# Patient Record
Sex: Female | Born: 1955
Health system: Southern US, Community
[De-identification: ages and names within clinical notes are randomized; demographics above are authoritative.]

## PROBLEM LIST (undated history)

## (undated) ENCOUNTER — Emergency Department (HOSPITAL_COMMUNITY): Discharge status: Against Medical Advice | Payer: BC Managed Care – PPO

## (undated) DIAGNOSIS — F329 Major depressive disorder, single episode, unspecified: Secondary | ICD-10-CM

## (undated) DIAGNOSIS — M797 Fibromyalgia: Secondary | ICD-10-CM

## (undated) DIAGNOSIS — A6920 Lyme disease, unspecified: Secondary | ICD-10-CM

## (undated) DIAGNOSIS — I10 Essential (primary) hypertension: Secondary | ICD-10-CM

## (undated) DIAGNOSIS — K224 Dyskinesia of esophagus: Secondary | ICD-10-CM

## (undated) HISTORY — PX: DIAGNOSTIC LAPAROSCOPY: SUR761

## (undated) HISTORY — PX: APPENDECTOMY: SHX54

## (undated) HISTORY — PX: OTHER SURGICAL HISTORY: SHX169

---

## 2007-01-10 HISTORY — PX: BREAST BIOPSY: SHX20

## 2008-11-03 ENCOUNTER — Encounter: Admission: RE | Admit: 2008-11-03 | Discharge: 2008-11-03 | Payer: Self-pay | Admitting: Family Medicine

## 2008-12-16 ENCOUNTER — Other Ambulatory Visit: Admission: RE | Admit: 2008-12-16 | Discharge: 2008-12-16 | Payer: Self-pay | Admitting: Family Medicine

## 2009-11-04 ENCOUNTER — Encounter: Admission: RE | Admit: 2009-11-04 | Discharge: 2009-11-04 | Payer: Self-pay | Admitting: Family Medicine

## 2010-05-19 ENCOUNTER — Other Ambulatory Visit: Payer: Self-pay | Admitting: Gastroenterology

## 2010-05-19 DIAGNOSIS — R1110 Vomiting, unspecified: Secondary | ICD-10-CM

## 2010-05-20 ENCOUNTER — Ambulatory Visit
Admission: RE | Admit: 2010-05-20 | Discharge: 2010-05-20 | Disposition: A | Discharge status: Home / Self Care | Payer: BC Managed Care – PPO | Source: Ambulatory Visit | Attending: Gastroenterology | Admitting: Gastroenterology

## 2010-05-20 DIAGNOSIS — R1110 Vomiting, unspecified: Secondary | ICD-10-CM

## 2010-05-25 ENCOUNTER — Other Ambulatory Visit (HOSPITAL_COMMUNITY): Payer: Self-pay | Admitting: Gastroenterology

## 2010-05-25 DIAGNOSIS — R1110 Vomiting, unspecified: Secondary | ICD-10-CM

## 2010-05-30 ENCOUNTER — Ambulatory Visit (HOSPITAL_COMMUNITY)
Admission: RE | Admit: 2010-05-30 | Discharge: 2010-05-30 | Disposition: A | Discharge status: Home / Self Care | Payer: BC Managed Care – PPO | Source: Ambulatory Visit | Attending: Gastroenterology | Admitting: Gastroenterology

## 2010-05-30 DIAGNOSIS — R1110 Vomiting, unspecified: Secondary | ICD-10-CM | POA: Insufficient documentation

## 2010-05-30 DIAGNOSIS — R932 Abnormal findings on diagnostic imaging of liver and biliary tract: Secondary | ICD-10-CM | POA: Insufficient documentation

## 2010-05-30 MED ORDER — TECHNETIUM TC 99M MEBROFENIN IV KIT
5.0000 | PACK | Freq: Once | INTRAVENOUS | Status: AC | PRN
  Administered 2010-05-30: 5.4 via INTRAVENOUS

## 2010-06-07 ENCOUNTER — Encounter (HOSPITAL_COMMUNITY)
Admission: RE | Admit: 2010-06-07 | Discharge: 2010-06-07 | Disposition: A | Discharge status: Home / Self Care | Payer: BC Managed Care – PPO | Source: Ambulatory Visit | Attending: Gastroenterology | Admitting: Gastroenterology

## 2010-06-07 DIAGNOSIS — K3189 Other diseases of stomach and duodenum: Secondary | ICD-10-CM | POA: Insufficient documentation

## 2010-06-07 DIAGNOSIS — R1110 Vomiting, unspecified: Secondary | ICD-10-CM | POA: Insufficient documentation

## 2010-06-07 DIAGNOSIS — R1013 Epigastric pain: Secondary | ICD-10-CM | POA: Insufficient documentation

## 2010-06-16 ENCOUNTER — Emergency Department (HOSPITAL_COMMUNITY)
Admission: EM | Admit: 2010-06-16 | Discharge: 2010-06-16 | Disposition: A | Discharge status: Home / Self Care | Payer: BC Managed Care – PPO | Attending: Emergency Medicine | Admitting: Emergency Medicine

## 2010-06-16 ENCOUNTER — Emergency Department (HOSPITAL_COMMUNITY): Payer: BC Managed Care – PPO

## 2010-06-16 DIAGNOSIS — R079 Chest pain, unspecified: Secondary | ICD-10-CM | POA: Insufficient documentation

## 2010-06-16 DIAGNOSIS — R1011 Right upper quadrant pain: Secondary | ICD-10-CM | POA: Insufficient documentation

## 2010-06-16 DIAGNOSIS — R630 Anorexia: Secondary | ICD-10-CM | POA: Insufficient documentation

## 2010-06-16 DIAGNOSIS — R197 Diarrhea, unspecified: Secondary | ICD-10-CM | POA: Insufficient documentation

## 2010-06-16 DIAGNOSIS — Z79899 Other long term (current) drug therapy: Secondary | ICD-10-CM | POA: Insufficient documentation

## 2010-06-16 DIAGNOSIS — R112 Nausea with vomiting, unspecified: Secondary | ICD-10-CM | POA: Insufficient documentation

## 2010-06-16 LAB — URINALYSIS, ROUTINE W REFLEX MICROSCOPIC
Nitrite: NEGATIVE
Specific Gravity, Urine: 1.01 (ref 1.005–1.030)
pH: 6 (ref 5.0–8.0)

## 2010-06-16 LAB — COMPREHENSIVE METABOLIC PANEL
Albumin: 4 g/dL (ref 3.5–5.2)
Alkaline Phosphatase: 56 U/L (ref 39–117)
BUN: 5 mg/dL — ABNORMAL LOW (ref 6–23)
Creatinine, Ser: 0.52 mg/dL (ref 0.4–1.2)
Potassium: 3.6 mEq/L (ref 3.5–5.1)
Total Protein: 7.5 g/dL (ref 6.0–8.3)

## 2010-06-16 LAB — URINE MICROSCOPIC-ADD ON

## 2010-06-16 LAB — CBC
MCV: 87.2 fL (ref 78.0–100.0)
Platelets: 262 10*3/uL (ref 150–400)
RDW: 14.3 % (ref 11.5–15.5)
WBC: 7.9 10*3/uL (ref 4.0–10.5)

## 2010-06-16 LAB — DIFFERENTIAL
Eosinophils Absolute: 0.1 10*3/uL (ref 0.0–0.7)
Eosinophils Relative: 1 % (ref 0–5)
Lymphs Abs: 3.1 10*3/uL (ref 0.7–4.0)

## 2010-06-17 LAB — URINE CULTURE
Colony Count: 9000
Culture  Setup Time: 201206072112

## 2010-07-07 ENCOUNTER — Other Ambulatory Visit: Payer: Self-pay | Admitting: Family Medicine

## 2010-07-07 ENCOUNTER — Other Ambulatory Visit (HOSPITAL_COMMUNITY)
Admission: RE | Admit: 2010-07-07 | Discharge: 2010-07-07 | Disposition: A | Discharge status: Home / Self Care | Payer: BC Managed Care – PPO | Source: Ambulatory Visit | Attending: Dermatology | Admitting: Dermatology

## 2010-07-07 DIAGNOSIS — Z0000 Encounter for general adult medical examination without abnormal findings: Secondary | ICD-10-CM | POA: Insufficient documentation

## 2010-09-26 ENCOUNTER — Other Ambulatory Visit: Payer: Self-pay | Admitting: Family Medicine

## 2010-09-26 DIAGNOSIS — Z1231 Encounter for screening mammogram for malignant neoplasm of breast: Secondary | ICD-10-CM

## 2010-11-07 ENCOUNTER — Ambulatory Visit: Payer: BC Managed Care – PPO

## 2010-11-21 ENCOUNTER — Ambulatory Visit
Admission: RE | Admit: 2010-11-21 | Discharge: 2010-11-21 | Disposition: A | Discharge status: Home / Self Care | Payer: BC Managed Care – PPO | Source: Ambulatory Visit | Attending: Family Medicine | Admitting: Family Medicine

## 2010-11-21 DIAGNOSIS — Z1231 Encounter for screening mammogram for malignant neoplasm of breast: Secondary | ICD-10-CM

## 2011-10-27 ENCOUNTER — Other Ambulatory Visit: Payer: Self-pay | Admitting: Family Medicine

## 2011-10-27 DIAGNOSIS — Z1231 Encounter for screening mammogram for malignant neoplasm of breast: Secondary | ICD-10-CM

## 2011-11-30 ENCOUNTER — Ambulatory Visit
Admission: RE | Admit: 2011-11-30 | Discharge: 2011-11-30 | Disposition: A | Discharge status: Home / Self Care | Payer: BC Managed Care – PPO | Source: Ambulatory Visit | Attending: Family Medicine | Admitting: Family Medicine

## 2011-11-30 DIAGNOSIS — Z1231 Encounter for screening mammogram for malignant neoplasm of breast: Secondary | ICD-10-CM

## 2012-02-25 IMAGING — US US ABDOMEN LIMITED
1 series · 14 of 21 positions shown · non-contrast
Comparison: 05/20/2010

CLINICAL DATA: Evaluate gallbladder

LIMITED ABDOMINAL ULTRASOUND - RIGHT UPPER QUADRANT

[Series 1: us abdomen limited · 0.29mm/px · 14 of 21 slices shown]
[im 1/21]
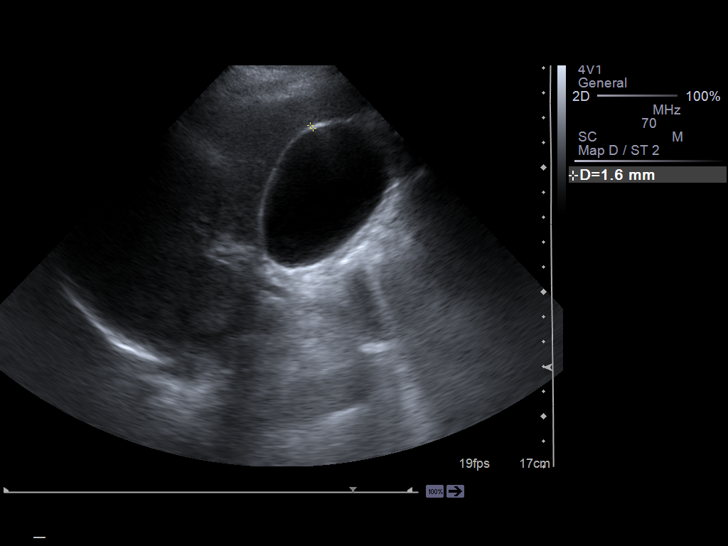
[im 3/21]
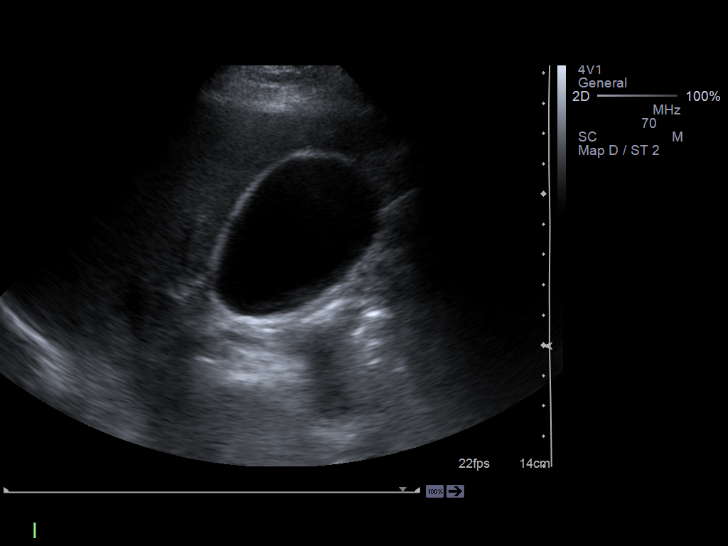
[im 4/21]
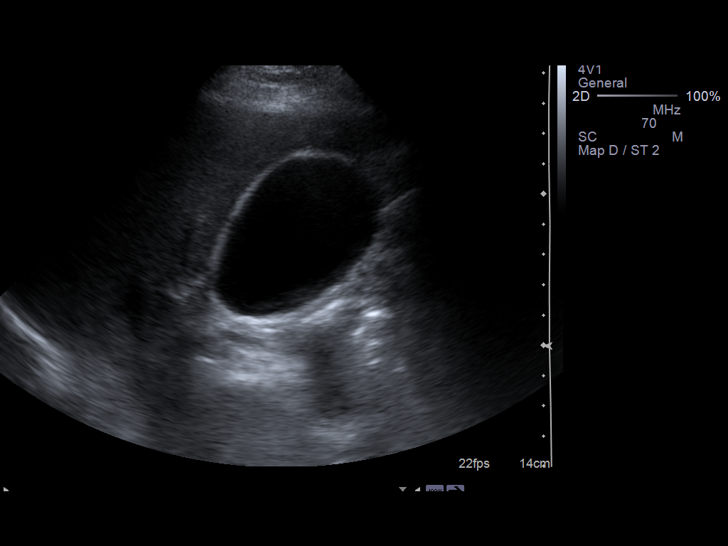
[im 6/21]
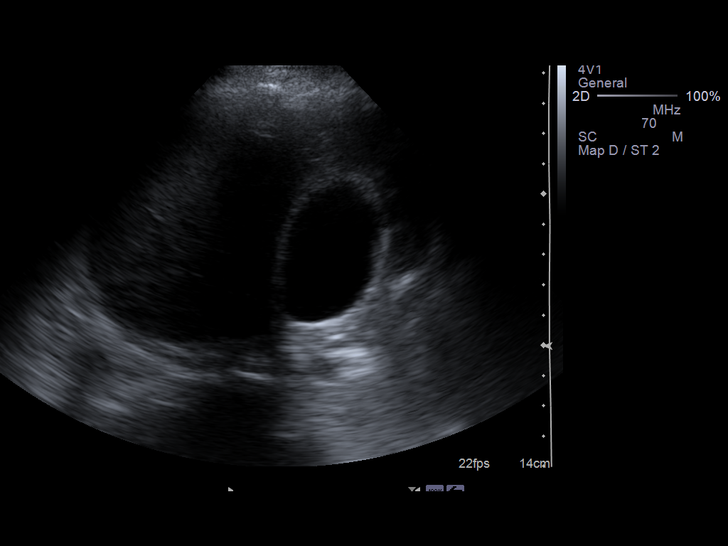
[im 7/21]
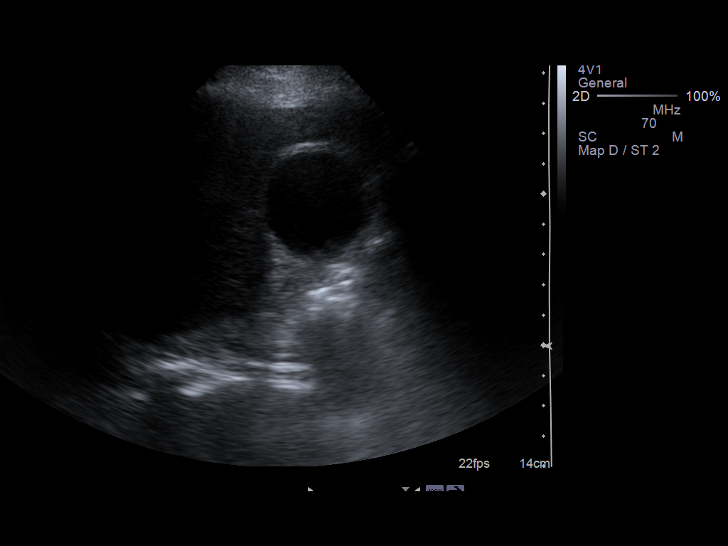
[im 9/21]
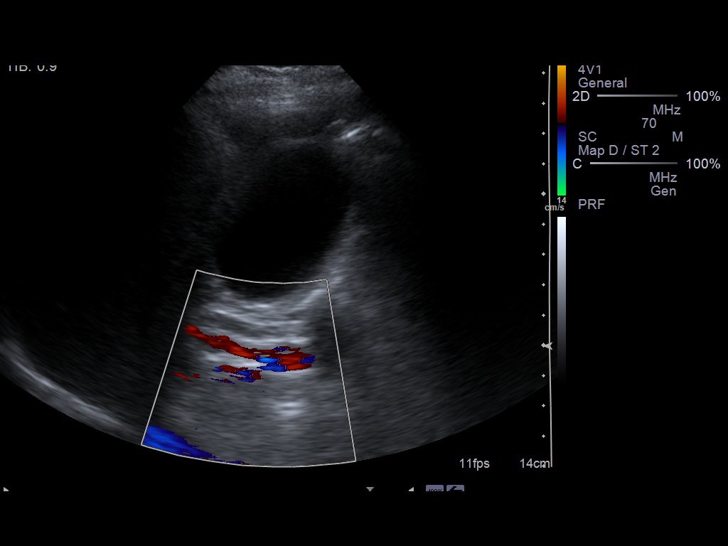
[im 10/21]
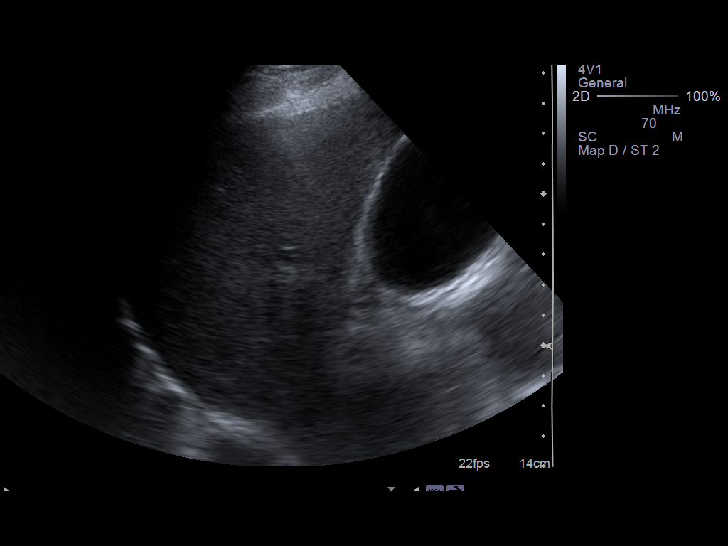
[im 12/21]
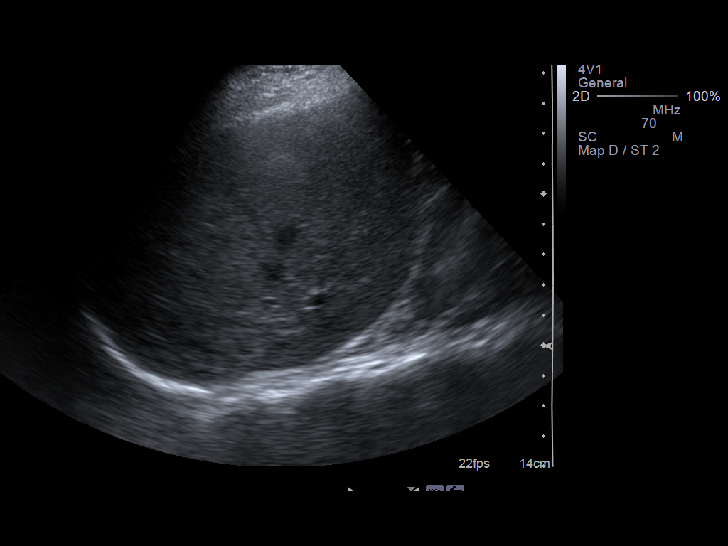
[im 13/21]
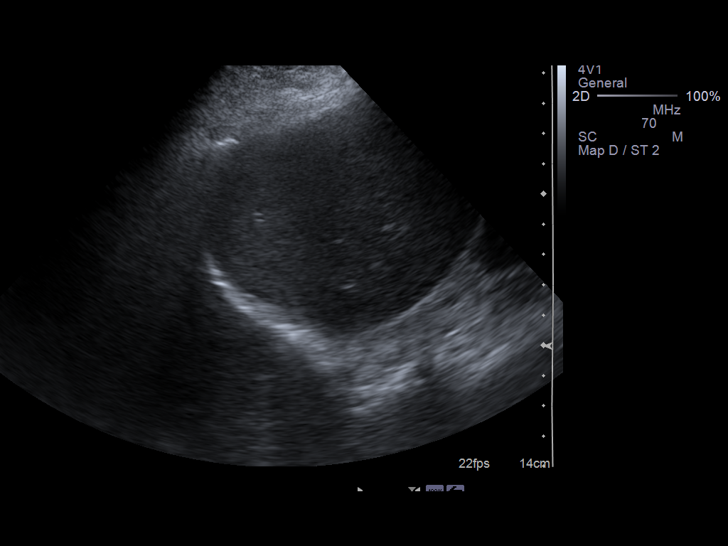
[im 15/21]
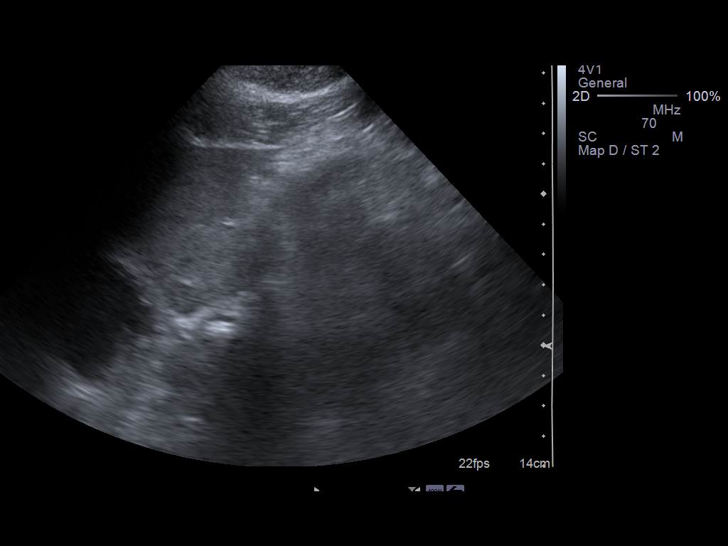
[im 16/21]
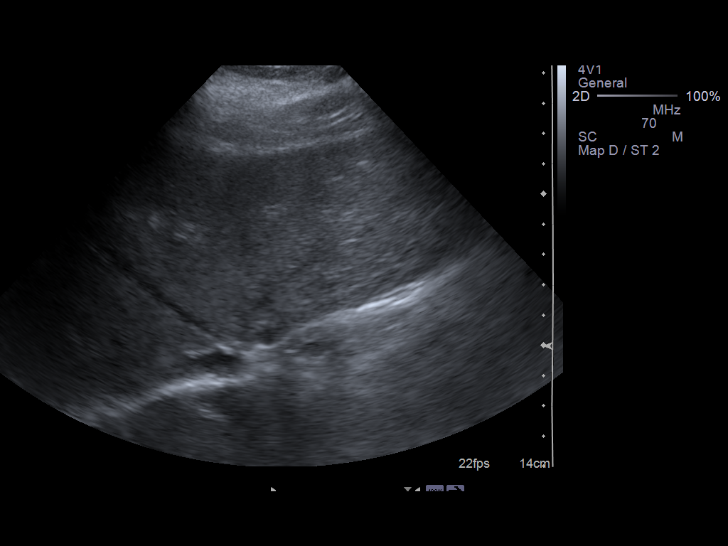
[im 18/21]
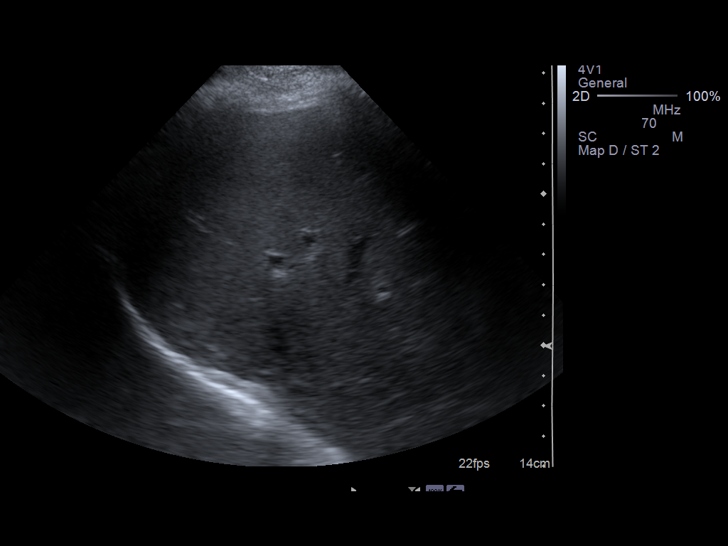
[im 19/21]
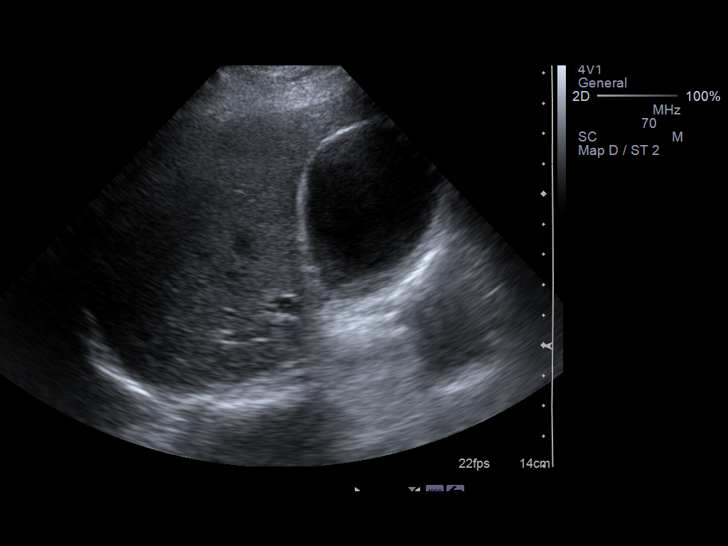
[im 21/21]
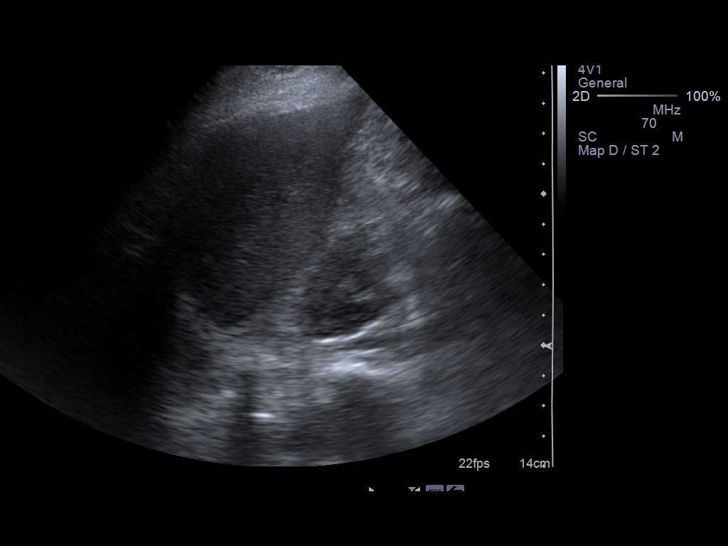

[14 of 21 positions shown; findings below may reference images not displayed]

FINDINGS: Gallbladder:  No cholelithiasis or gallbladder sludge.  Gallbladder
wall is not thickened and there is no pericholecystic edema.

Common bile duct:  Normal caliber bile ducts with common duct
diameter measuring about 5 mm.

Liver:  Homogeneous liver parenchymal echotexture without focal
mass lesion demonstrated.  No intrahepatic bile duct dilatation.
IMPRESSION: No evidence of cholelithiasis or cholecystitis.

## 2012-11-04 ENCOUNTER — Other Ambulatory Visit: Payer: Self-pay

## 2012-11-04 DIAGNOSIS — Z1231 Encounter for screening mammogram for malignant neoplasm of breast: Secondary | ICD-10-CM

## 2012-12-03 ENCOUNTER — Ambulatory Visit
Admission: RE | Admit: 2012-12-03 | Discharge: 2012-12-03 | Disposition: A | Discharge status: Home / Self Care | Payer: BC Managed Care – PPO | Source: Ambulatory Visit

## 2012-12-03 DIAGNOSIS — Z1231 Encounter for screening mammogram for malignant neoplasm of breast: Secondary | ICD-10-CM

## 2013-12-08 ENCOUNTER — Other Ambulatory Visit: Payer: Self-pay

## 2013-12-08 DIAGNOSIS — Z1231 Encounter for screening mammogram for malignant neoplasm of breast: Secondary | ICD-10-CM

## 2013-12-11 ENCOUNTER — Other Ambulatory Visit (HOSPITAL_COMMUNITY)
Admission: RE | Admit: 2013-12-11 | Discharge: 2013-12-11 | Disposition: A | Discharge status: Home / Self Care | Payer: BC Managed Care – PPO | Source: Ambulatory Visit | Attending: Family Medicine | Admitting: Family Medicine

## 2013-12-11 ENCOUNTER — Other Ambulatory Visit: Payer: Self-pay | Admitting: Family Medicine

## 2013-12-11 DIAGNOSIS — Z01419 Encounter for gynecological examination (general) (routine) without abnormal findings: Secondary | ICD-10-CM | POA: Insufficient documentation

## 2013-12-12 LAB — CYTOLOGY - PAP

## 2013-12-18 ENCOUNTER — Ambulatory Visit
Admission: RE | Admit: 2013-12-18 | Discharge: 2013-12-18 | Disposition: A | Discharge status: Home / Self Care | Payer: BC Managed Care – PPO | Source: Ambulatory Visit

## 2013-12-18 DIAGNOSIS — Z1231 Encounter for screening mammogram for malignant neoplasm of breast: Secondary | ICD-10-CM

## 2014-11-30 ENCOUNTER — Other Ambulatory Visit: Payer: Self-pay

## 2014-11-30 DIAGNOSIS — Z1231 Encounter for screening mammogram for malignant neoplasm of breast: Secondary | ICD-10-CM

## 2014-12-22 ENCOUNTER — Ambulatory Visit
Admission: RE | Admit: 2014-12-22 | Discharge: 2014-12-22 | Disposition: A | Discharge status: Home / Self Care | Payer: BLUE CROSS/BLUE SHIELD | Source: Ambulatory Visit

## 2014-12-22 DIAGNOSIS — Z1231 Encounter for screening mammogram for malignant neoplasm of breast: Secondary | ICD-10-CM

## 2015-06-02 DIAGNOSIS — K219 Gastro-esophageal reflux disease without esophagitis: Secondary | ICD-10-CM | POA: Diagnosis not present

## 2015-06-02 DIAGNOSIS — K3184 Gastroparesis: Secondary | ICD-10-CM | POA: Diagnosis not present

## 2015-06-16 DIAGNOSIS — I10 Essential (primary) hypertension: Secondary | ICD-10-CM | POA: Diagnosis not present

## 2015-06-16 DIAGNOSIS — E785 Hyperlipidemia, unspecified: Secondary | ICD-10-CM | POA: Diagnosis not present

## 2015-06-16 DIAGNOSIS — F5101 Primary insomnia: Secondary | ICD-10-CM | POA: Diagnosis not present

## 2015-06-16 DIAGNOSIS — M797 Fibromyalgia: Secondary | ICD-10-CM | POA: Diagnosis not present

## 2015-08-09 ENCOUNTER — Encounter (HOSPITAL_COMMUNITY): Payer: Self-pay | Admitting: Emergency Medicine

## 2015-08-09 ENCOUNTER — Emergency Department (HOSPITAL_COMMUNITY): Payer: BLUE CROSS/BLUE SHIELD

## 2015-08-09 ENCOUNTER — Ambulatory Visit (HOSPITAL_COMMUNITY)
Admission: EM | Admit: 2015-08-09 | Discharge: 2015-08-11 | Disposition: A | Discharge status: Home / Self Care | Payer: BLUE CROSS/BLUE SHIELD | Attending: General Surgery | Admitting: General Surgery

## 2015-08-09 DIAGNOSIS — M797 Fibromyalgia: Secondary | ICD-10-CM | POA: Diagnosis not present

## 2015-08-09 DIAGNOSIS — F32A Depression, unspecified: Secondary | ICD-10-CM | POA: Diagnosis present

## 2015-08-09 DIAGNOSIS — Z79899 Other long term (current) drug therapy: Secondary | ICD-10-CM | POA: Diagnosis not present

## 2015-08-09 DIAGNOSIS — K3580 Unspecified acute appendicitis: Secondary | ICD-10-CM | POA: Diagnosis present

## 2015-08-09 DIAGNOSIS — K353 Acute appendicitis with localized peritonitis, without perforation or gangrene: Secondary | ICD-10-CM

## 2015-08-09 DIAGNOSIS — K224 Dyskinesia of esophagus: Secondary | ICD-10-CM | POA: Diagnosis not present

## 2015-08-09 DIAGNOSIS — F329 Major depressive disorder, single episode, unspecified: Secondary | ICD-10-CM | POA: Diagnosis not present

## 2015-08-09 DIAGNOSIS — I10 Essential (primary) hypertension: Secondary | ICD-10-CM | POA: Insufficient documentation

## 2015-08-09 DIAGNOSIS — Z87442 Personal history of urinary calculi: Secondary | ICD-10-CM | POA: Insufficient documentation

## 2015-08-09 HISTORY — DX: Essential (primary) hypertension: I10

## 2015-08-09 HISTORY — DX: Lyme disease, unspecified: A69.20

## 2015-08-09 HISTORY — DX: Fibromyalgia: M79.7

## 2015-08-09 HISTORY — DX: Dyskinesia of esophagus: K22.4

## 2015-08-09 HISTORY — DX: Major depressive disorder, single episode, unspecified: F32.9

## 2015-08-09 LAB — COMPREHENSIVE METABOLIC PANEL
ALBUMIN: 4.3 g/dL (ref 3.5–5.0)
ALK PHOS: 67 U/L (ref 38–126)
ALT: 14 U/L (ref 14–54)
ANION GAP: 8 (ref 5–15)
AST: 21 U/L (ref 15–41)
BUN: 9 mg/dL (ref 6–20)
CALCIUM: 9.3 mg/dL (ref 8.9–10.3)
CHLORIDE: 104 mmol/L (ref 101–111)
CO2: 29 mmol/L (ref 22–32)
Creatinine, Ser: 0.77 mg/dL (ref 0.44–1.00)
GFR calc non Af Amer: >60 mL/min (ref >60)
GLUCOSE: 114 mg/dL — AB (ref 65–99)
Potassium: 3.6 mmol/L (ref 3.5–5.1)
SODIUM: 141 mmol/L (ref 135–145)
Total Bilirubin: 0.7 mg/dL (ref 0.3–1.2)
Total Protein: 7.9 g/dL (ref 6.5–8.1)

## 2015-08-09 LAB — CBC
HCT: 35.7 % — ABNORMAL LOW (ref 36.0–46.0)
HEMOGLOBIN: 11.9 g/dL — AB (ref 12.0–15.0)
MCH: 30.6 pg (ref 26.0–34.0)
MCHC: 33.3 g/dL (ref 30.0–36.0)
MCV: 91.8 fL (ref 78.0–100.0)
Platelets: 279 10*3/uL (ref 150–400)
RBC: 3.89 MIL/uL (ref 3.87–5.11)
RDW: 13.9 % (ref 11.5–15.5)
WBC: 12.7 10*3/uL — ABNORMAL HIGH (ref 4.0–10.5)

## 2015-08-09 LAB — URINALYSIS, ROUTINE W REFLEX MICROSCOPIC
BILIRUBIN URINE: NEGATIVE
GLUCOSE, UA: NEGATIVE mg/dL
HGB URINE DIPSTICK: NEGATIVE
Ketones, ur: NEGATIVE mg/dL
Leukocytes, UA: NEGATIVE
Nitrite: NEGATIVE
PH: 6 (ref 5.0–8.0)
Protein, ur: NEGATIVE mg/dL
SPECIFIC GRAVITY, URINE: 1.014 (ref 1.005–1.030)

## 2015-08-09 LAB — LIPASE, BLOOD: LIPASE: 28 U/L (ref 11–51)

## 2015-08-09 MED ORDER — ONDANSETRON HCL 4 MG/2ML IJ SOLN
4.0000 mg | Freq: Once | INTRAMUSCULAR | Status: AC
  Administered 2015-08-09: 4 mg via INTRAVENOUS
  Filled 2015-08-09: qty 2

## 2015-08-09 MED ORDER — MORPHINE SULFATE (PF) 4 MG/ML IV SOLN
4.0000 mg | Freq: Once | INTRAVENOUS | Status: AC
  Administered 2015-08-09: 4 mg via INTRAVENOUS
  Filled 2015-08-09: qty 1

## 2015-08-09 MED ORDER — FENTANYL CITRATE (PF) 100 MCG/2ML IJ SOLN
50.0000 ug | INTRAMUSCULAR | Status: DC | PRN
  Administered 2015-08-09: 50 ug via INTRAVENOUS
  Filled 2015-08-09: qty 2

## 2015-08-09 NOTE — ED Notes (Signed)
Bed: WTR7 Expected date:  Expected time:  Means of arrival:  Comments: Blood Draw

## 2015-08-09 NOTE — ED Triage Notes (Signed)
Pt is c/o right side abd pain that started at 4 am and has progressively gotten worse all day  Pt states the pain is a cramping pain that radiates toward the umbilicus  Pt denies N/V/D

## 2015-08-09 NOTE — ED Provider Notes (Signed)
[facility]-EMERGENCY DEPT Provider Note   CSN: [id number] Arrival date & time: 08/09/15  1950  First Provider Contact:  First MD Initiated Contact with Patient 08/09/15 2324   By signing my name below, I, Bridgette Habermann, attest that this documentation has been prepared under the direction and in the presence of Dione Booze, MD. Electronically Signed: Bridgette Habermann, ED Scribe. 08/09/15. 11:33 PM.  History   Chief Complaint Chief Complaint  Patient presents with  . Abdominal Pain   HPI Comments: Patricia Walls is a 60 y.o. female with h/o HTN who presents to the Emergency Department complaining of sudden onset, constant, gradually worsening, sharp, cramping, 10/10, RLQ abdominal pain radiating toward the umbilicus onset 4 am today. The pain woke pt up from her sleep. Pt has no associated symptoms. Per pt, pain is exacerbated with movement and palpation. No alleviating factors noted. Pt notes h/o kidney stones 30 years ago. Pt is in no acute distress at this time. Denies fever, chills, constipation, diarrhea, difficulty urinating, or any other associated symptoms.   The history is provided by the patient. No language interpreter was used.   Past Medical History:  Diagnosis Date  . Fibromyalgia   . Lyme disease     There are no active problems to display for this patient.   Past Surgical History:  Procedure Laterality Date  . extraction of wisdom teeth      OB History    No data available     Home Medications    Prior to Admission medications   Medication Sig Start Date End Date Taking? Authorizing Provider  amLODipine (NORVASC) 10 MG tablet Take 10 mg by mouth daily with breakfast.   Yes Historical Provider, MD  calcium-vitamin D (OSCAL WITH D) 500-200 MG-UNIT tablet Take 1 tablet by mouth daily.   Yes Historical Provider, MD  cloNIDine (CATAPRES) 0.2 MG tablet Take 0.2 mg by mouth every evening.   Yes Historical Provider, MD  gabapentin (NEURONTIN) 300 MG capsule Take 300 mg by mouth 3  (three) times daily.   Yes Historical Provider, MD  hydrochlorothiazide (MICROZIDE) 12.5 MG capsule Take 12.5 mg by mouth daily with breakfast.   Yes Historical Provider, MD  lisinopril (PRINIVIL,ZESTRIL) 40 MG tablet Take 40 mg by mouth every evening.   Yes Historical Provider, MD  Multiple Vitamin (MULTIVITAMIN WITH MINERALS) TABS tablet Take 1 tablet by mouth daily with breakfast.   Yes Historical Provider, MD  nortriptyline (PAMELOR) 50 MG capsule Take 100 mg by mouth every morning.   Yes Historical Provider, MD  ondansetron (ZOFRAN-ODT) 4 MG disintegrating tablet Take 4 mg by mouth once.   Yes Historical Provider, MD  pantoprazole (PROTONIX) 40 MG tablet Take 40 mg by mouth 2 (two) times daily.   Yes Historical Provider, MD  Probiotic Product (DIGESTIVE ADVANTAGE PO) Take 2 tablets by mouth daily.   Yes Historical Provider, MD  pyridostigmine (MESTINON) 60 MG tablet Take 30 mg by mouth 3 (three) times daily.   Yes Historical Provider, MD  ranitidine (ZANTAC) 150 MG tablet Take 150 mg by mouth 2 (two) times daily.   Yes Historical Provider, MD  simvastatin (ZOCOR) 20 MG tablet Take 20 mg by mouth every evening.   Yes Historical Provider, MD  tiZANidine (ZANAFLEX) 4 MG tablet Take 4 mg by mouth at bedtime.   Yes Historical Provider, MD  zolpidem (AMBIEN) 10 MG tablet Take 5-10 mg by mouth at bedtime as needed for sleep.   Yes Historical Provider, MD   Family  History Family History  Problem Relation Age of Onset  . Hypertension Other   . Hyperlipidemia Other    Social History Social History  Substance Use Topics  . Smoking status: Never Smoker  . Smokeless tobacco: Never Used  . Alcohol use No   Allergies   Review of patient's allergies indicates no known allergies. Review of Systems Review of Systems  Constitutional: Negative for chills and fever.  Gastrointestinal: Positive for abdominal pain. Negative for constipation, diarrhea, nausea and vomiting.  Genitourinary: Negative for  difficulty urinating.  All other systems reviewed and are negative.  Physical Exam Updated Vital Signs BP 140/87 (BP Location: Left Arm)   Pulse 91   Temp 98.5 F (36.9 C) (Oral)   Resp 18   Ht 5' 6.5" (1.689 m)   Wt 140 lb (63.5 kg)   SpO2 100%   BMI 22.26 kg/m   Physical Exam  Constitutional: She is oriented to person, place, and time. She appears well-developed and well-nourished.  HENT:  Head: Normocephalic and atraumatic.  Eyes: EOM are normal. Pupils are equal, round, and reactive to light.  Neck: Normal range of motion. Neck supple. No JVD present.  Cardiovascular: Normal rate, regular rhythm and normal heart sounds.   No murmur heard. Pulmonary/Chest: Effort normal and breath sounds normal. She has no wheezes. She has no rales. She exhibits no tenderness.  Abdominal: Soft. She exhibits no distension and no mass. There is tenderness. There is no rebound and no guarding.  Moderate tenderness RLQ. Bowel sounds decreased. No CVA tenderness.  Musculoskeletal: Normal range of motion. She exhibits no edema.  Lymphadenopathy:    She has no cervical adenopathy.  Neurological: She is alert and oriented to person, place, and time. No cranial nerve deficit. She exhibits normal muscle tone. Coordination normal.  Skin: Skin is warm and dry. No rash noted.  Psychiatric: She has a normal mood and affect. Her behavior is normal. Judgment and thought content normal.  Nursing note and vitals reviewed.  ED Treatments / Results  DIAGNOSTIC STUDIES: Oxygen Saturation is 100% on RA, normal by my interpretation.    COORDINATION OF CARE: 11:28 PM Discussed treatment plan with pt at bedside which includes pain and nausea medication and CT scan and pt agreed to plan.  Labs (all labs ordered are listed, but only abnormal results are displayed) Labs Reviewed  COMPREHENSIVE METABOLIC PANEL - Abnormal; Notable for the following:       Result Value   Glucose, Bld 114 (*)    All other  components within normal limits  CBC - Abnormal; Notable for the following:    WBC 12.7 (*)    Hemoglobin 11.9 (*)    HCT 35.7 (*)    All other components within normal limits  URINALYSIS, ROUTINE W REFLEX MICROSCOPIC (NOT AT [facility]) - Abnormal; Notable for the following:    APPearance CLOUDY (*)    All other components within normal limits  LIPASE, BLOOD     Radiology Ct Abdomen Pelvis W Contrast  Result Date: 08/10/2015 CLINICAL DATA:  Abdominal pain, onset today. Right lower quadrant pain. EXAM: CT ABDOMEN AND PELVIS WITH CONTRAST TECHNIQUE: Multidetector CT imaging of the abdomen and pelvis was performed using the standard protocol following bolus administration of intravenous contrast. CONTRAST:  ISOVUE-300 IOPAMIDOL (ISOVUE-300) INJECTION 61% COMPARISON:  None. FINDINGS: Lower chest: The included lung bases are clear. Heart is upper limits of normal in size. Liver: Homogeneous in density.  No focal lesion. Hepatobiliary: Questionable small dependent gallstones.  Gallbladder physiologically distended. No biliary dilatation. Pancreas: No ductal dilatation or inflammation. Spleen: Normal in size.  Small granuloma inferiorly. Adrenal glands: No nodule. Kidneys: Symmetric renal enhancement. No hydronephrosis. No perinephric edema. Stomach/Bowel: Stomach physiologically distended. There are no dilated or thickened small bowel loops. Moderate to large volume of stool throughout the colon without colonic wall thickening. Appendix: Dilated, fluid-filled containing intraluminal appendicoliths. There is wall enhancement and adjacent periappendiceal fat stranding. No extraluminal air, abscess or perforation. Vascular/Lymphatic: No retroperitoneal adenopathy. Abdominal aorta is normal in caliber. Reproductive: Enhancing focus in the uterine fundus likely a fibroid. No adnexal mass. Bladder: Physiologically distended.  No wall thickening. Other: No free air, free fluid, or intra-abdominal fluid collection.  Fat lobule in the pelvis just posterior to the bladder measuring 12 mm with surrounding soft tissue density may be sequela of fat necrosis, no acute inflammation. No suspicious characteristics. Musculoskeletal: There are no acute or suspicious osseous abnormalities. IMPRESSION: Uncomplicated acute appendicitis. Electronically Signed   By: Rubye Oaks M.D.   On: 08/10/2015 00:28    Procedures Procedures (including critical care time)  Medications Ordered in ED Medications  cefTRIAXone (ROCEPHIN) 1 g in dextrose 5 % 50 mL IVPB (1 g Intravenous New Bag/Given 08/10/15 0117)  metroNIDAZOLE (FLAGYL) IVPB 500 mg (500 mg Intravenous New Bag/Given 08/10/15 0118)  0.9 %  sodium chloride infusion (1,000 mLs Intravenous New Bag/Given 08/10/15 0117)    Followed by  0.9 %  sodium chloride infusion (1,000 mLs Intravenous New Bag/Given 08/10/15 0118)  morphine 4 MG/ML injection 4 mg (4 mg Intravenous Given 08/09/15 2341)  ondansetron (ZOFRAN) injection 4 mg (4 mg Intravenous Given 08/09/15 2341)  iopamidol (ISOVUE-300) 61 % injection 100 mL (100 mLs Intravenous Contrast Given 08/10/15 0004)  morphine 4 MG/ML injection 4 mg (4 mg Intravenous Given 08/10/15 0117)   Initial Impression / Assessment and Plan / ED Course  I have reviewed the triage vital signs and the nursing notes.  Pertinent labs & imaging results that were available during my care of the patient were reviewed by me and considered in my medical decision making (see chart for details).  Clinical Course    Abdominal pain-possible appendicitis, possible renal colic. Biliary colic unlikely with tenderness in the right lower abdomen. Also consider diverticulitis, pancreatitis. Screening labs are obtained showing modest leukocytosis. Old records are reviewed and she had abdominal ultrasound done in 2012 showing no evidence of gallstones. She is sent for CT of abdomen and pelvis showing evidence of acute appendicitis. She started on antibiotics of ceftriaxone  and metronidazole. Case is discussed with Dr. Gerrit [facility] who agrees to admit the patient for surgical management.  Final Clinical Impressions(s) / ED Diagnoses   Final diagnoses:  Acute appendicitis with localized peritonitis    New Prescriptions New Prescriptions   No medications on file  I personally performed the services described in this documentation, which was scribed in my presence. The recorded information has been reviewed and is accurate.       Dione Booze, MD 08/10/15 [phone]

## 2015-08-10 ENCOUNTER — Encounter (HOSPITAL_COMMUNITY): Payer: Self-pay

## 2015-08-10 ENCOUNTER — Observation Stay (HOSPITAL_COMMUNITY): Payer: BLUE CROSS/BLUE SHIELD | Admitting: Registered Nurse

## 2015-08-10 ENCOUNTER — Encounter (HOSPITAL_COMMUNITY): Admission: EM | Disposition: A | Discharge status: Home / Self Care | Payer: Self-pay | Source: Home / Self Care

## 2015-08-10 DIAGNOSIS — K37 Unspecified appendicitis: Secondary | ICD-10-CM | POA: Diagnosis not present

## 2015-08-10 DIAGNOSIS — K224 Dyskinesia of esophagus: Secondary | ICD-10-CM

## 2015-08-10 DIAGNOSIS — K3580 Unspecified acute appendicitis: Secondary | ICD-10-CM | POA: Diagnosis present

## 2015-08-10 DIAGNOSIS — F32A Depression, unspecified: Secondary | ICD-10-CM

## 2015-08-10 DIAGNOSIS — I10 Essential (primary) hypertension: Secondary | ICD-10-CM

## 2015-08-10 DIAGNOSIS — R1084 Generalized abdominal pain: Secondary | ICD-10-CM | POA: Diagnosis not present

## 2015-08-10 DIAGNOSIS — F329 Major depressive disorder, single episode, unspecified: Secondary | ICD-10-CM | POA: Diagnosis present

## 2015-08-10 HISTORY — DX: Dyskinesia of esophagus: K22.4

## 2015-08-10 HISTORY — PX: LAPAROSCOPIC APPENDECTOMY: SHX408

## 2015-08-10 HISTORY — DX: Essential (primary) hypertension: I10

## 2015-08-10 HISTORY — DX: Depression, unspecified: F32.A

## 2015-08-10 LAB — SURGICAL PCR SCREEN
MRSA, PCR: NEGATIVE
STAPHYLOCOCCUS AUREUS: NEGATIVE

## 2015-08-10 SURGERY — APPENDECTOMY, LAPAROSCOPIC
Anesthesia: General | Site: Abdomen

## 2015-08-10 MED ORDER — SUCCINYLCHOLINE CHLORIDE 20 MG/ML IJ SOLN
INTRAMUSCULAR | Status: DC | PRN
  Administered 2015-08-10: 100 mg via INTRAVENOUS

## 2015-08-10 MED ORDER — METRONIDAZOLE IN NACL 5-0.79 MG/ML-% IV SOLN: 500.0000 mg | Freq: Three times a day (TID) | INTRAVENOUS | Status: DC

## 2015-08-10 MED ORDER — DEXTROSE 5 % IV SOLN
1.0000 g | Freq: Once | INTRAVENOUS | Status: AC
  Administered 2015-08-10: 1 g via INTRAVENOUS
  Filled 2015-08-10: qty 10

## 2015-08-10 MED ORDER — CLONIDINE HCL 0.1 MG PO TABS
0.2000 mg | ORAL_TABLET | Freq: Every evening | ORAL | Status: DC
  Administered 2015-08-10: 0.2 mg via ORAL
  Filled 2015-08-10: qty 2

## 2015-08-10 MED ORDER — SODIUM CHLORIDE 0.9 % IV SOLN
1000.0000 mL | INTRAVENOUS | Status: DC
  Administered 2015-08-10 (×2): 1000 mL via INTRAVENOUS

## 2015-08-10 MED ORDER — ONDANSETRON HCL 4 MG/2ML IJ SOLN: 4.0000 mg | INTRAMUSCULAR | Status: DC | PRN

## 2015-08-10 MED ORDER — GLYCOPYRROLATE 0.2 MG/ML IJ SOLN
INTRAMUSCULAR | Status: DC | PRN
  Administered 2015-08-10: 0.2 mg via INTRAVENOUS

## 2015-08-10 MED ORDER — LISINOPRIL 20 MG PO TABS
40.0000 mg | ORAL_TABLET | Freq: Every evening | ORAL | Status: DC
  Administered 2015-08-10: 40 mg via ORAL
  Filled 2015-08-10: qty 2

## 2015-08-10 MED ORDER — HYDROMORPHONE HCL 1 MG/ML IJ SOLN
1.0000 mg | INTRAMUSCULAR | Status: DC | PRN
  Administered 2015-08-10: 1 mg via INTRAVENOUS
  Filled 2015-08-10: qty 1

## 2015-08-10 MED ORDER — SODIUM CHLORIDE 0.9 % IJ SOLN
INTRAMUSCULAR | Status: AC
Start: 2015-08-10 — End: 2015-08-10
  Filled 2015-08-10: qty 10

## 2015-08-10 MED ORDER — DEXTROSE 5 % IV SOLN
2.0000 g | INTRAVENOUS | Status: DC
  Administered 2015-08-10: 2 g via INTRAVENOUS
  Filled 2015-08-10: qty 2

## 2015-08-10 MED ORDER — IOPAMIDOL (ISOVUE-300) INJECTION 61%
100.0000 mL | Freq: Once | INTRAVENOUS | Status: AC | PRN
  Administered 2015-08-10: 100 mL via INTRAVENOUS

## 2015-08-10 MED ORDER — TIZANIDINE HCL 4 MG PO TABS
4.0000 mg | ORAL_TABLET | Freq: Every day | ORAL | Status: DC
  Administered 2015-08-10: 4 mg via ORAL
  Filled 2015-08-10: qty 1

## 2015-08-10 MED ORDER — LACTATED RINGERS IR SOLN
Status: DC | PRN
  Administered 2015-08-10: 1000 mL

## 2015-08-10 MED ORDER — DEXAMETHASONE SODIUM PHOSPHATE 10 MG/ML IJ SOLN
INTRAMUSCULAR | Status: DC | PRN
  Administered 2015-08-10: 10 mg via INTRAVENOUS

## 2015-08-10 MED ORDER — LACTATED RINGERS IV SOLN
INTRAVENOUS | Status: DC | PRN
  Administered 2015-08-10 (×2): via INTRAVENOUS

## 2015-08-10 MED ORDER — MORPHINE SULFATE (PF) 4 MG/ML IV SOLN
4.0000 mg | Freq: Once | INTRAVENOUS | Status: AC
  Administered 2015-08-10: 4 mg via INTRAVENOUS
  Filled 2015-08-10: qty 1

## 2015-08-10 MED ORDER — LACTATED RINGERS IV SOLN: INTRAVENOUS | Status: DC

## 2015-08-10 MED ORDER — DEXAMETHASONE SODIUM PHOSPHATE 10 MG/ML IJ SOLN
INTRAMUSCULAR | Status: AC
  Filled 2015-08-10: qty 1

## 2015-08-10 MED ORDER — LIDOCAINE HCL (CARDIAC) 20 MG/ML IV SOLN
INTRAVENOUS | Status: DC | PRN
  Administered 2015-08-10: 100 mg via INTRAVENOUS

## 2015-08-10 MED ORDER — GABAPENTIN 300 MG PO CAPS
300.0000 mg | ORAL_CAPSULE | Freq: Three times a day (TID) | ORAL | Status: DC
  Administered 2015-08-10 – 2015-08-11 (×3): 300 mg via ORAL
  Filled 2015-08-10 (×3): qty 1

## 2015-08-10 MED ORDER — POTASSIUM CHLORIDE IN NACL 20-0.9 MEQ/L-% IV SOLN
INTRAVENOUS | Status: DC
Start: 2015-08-10 — End: 2015-08-10
  Administered 2015-08-10: 100 mL/h via INTRAVENOUS
  Filled 2015-08-10: qty 1000

## 2015-08-10 MED ORDER — SODIUM CHLORIDE 0.9 % IV SOLN: INTRAVENOUS | Status: DC

## 2015-08-10 MED ORDER — SUGAMMADEX SODIUM 200 MG/2ML IV SOLN
INTRAVENOUS | Status: AC
  Filled 2015-08-10: qty 2

## 2015-08-10 MED ORDER — 0.9 % SODIUM CHLORIDE (POUR BTL) OPTIME
TOPICAL | Status: DC | PRN
  Administered 2015-08-10: 1000 mL

## 2015-08-10 MED ORDER — AMLODIPINE BESYLATE 10 MG PO TABS
10.0000 mg | ORAL_TABLET | Freq: Every day | ORAL | Status: DC
  Filled 2015-08-10: qty 1

## 2015-08-10 MED ORDER — GLYCOPYRROLATE 0.2 MG/ML IJ SOLN
INTRAMUSCULAR | Status: AC
  Filled 2015-08-10: qty 1

## 2015-08-10 MED ORDER — DEXTROSE 5 % IV SOLN: 2.0000 g | INTRAVENOUS | Status: DC

## 2015-08-10 MED ORDER — HEPARIN SODIUM (PORCINE) 5000 UNIT/ML IJ SOLN: 5000.0000 [IU] | Freq: Three times a day (TID) | INTRAMUSCULAR | Status: DC

## 2015-08-10 MED ORDER — HEPARIN SODIUM (PORCINE) 5000 UNIT/ML IJ SOLN
5000.0000 [IU] | Freq: Three times a day (TID) | INTRAMUSCULAR | Status: DC
  Administered 2015-08-10 – 2015-08-11 (×2): 5000 [IU] via SUBCUTANEOUS
  Filled 2015-08-10 (×2): qty 1

## 2015-08-10 MED ORDER — LIDOCAINE HCL (CARDIAC) 20 MG/ML IV SOLN: INTRAVENOUS | Status: DC | PRN

## 2015-08-10 MED ORDER — ONDANSETRON 4 MG PO TBDP: 4.0000 mg | ORAL_TABLET | Freq: Four times a day (QID) | ORAL | Status: DC | PRN

## 2015-08-10 MED ORDER — MIDAZOLAM HCL 5 MG/5ML IJ SOLN
INTRAMUSCULAR | Status: DC | PRN
  Administered 2015-08-10 (×2): 1 mg via INTRAVENOUS

## 2015-08-10 MED ORDER — MORPHINE SULFATE (PF) 2 MG/ML IV SOLN: 2.0000 mg | INTRAVENOUS | Status: DC | PRN

## 2015-08-10 MED ORDER — SODIUM CHLORIDE 0.9 % IV SOLN
1000.0000 mL | Freq: Once | INTRAVENOUS | Status: AC
  Administered 2015-08-10: 1000 mL via INTRAVENOUS

## 2015-08-10 MED ORDER — ONDANSETRON HCL 4 MG/2ML IJ SOLN
INTRAMUSCULAR | Status: AC
  Filled 2015-08-10: qty 2

## 2015-08-10 MED ORDER — KCL IN DEXTROSE-NACL 20-5-0.9 MEQ/L-%-% IV SOLN
INTRAVENOUS | Status: DC
  Administered 2015-08-10: 100 mL/h via INTRAVENOUS
  Administered 2015-08-11: 03:00:00 via INTRAVENOUS
  Filled 2015-08-10 (×2): qty 1000

## 2015-08-10 MED ORDER — PYRIDOSTIGMINE BROMIDE 60 MG PO TABS
30.0000 mg | ORAL_TABLET | Freq: Three times a day (TID) | ORAL | Status: DC
  Administered 2015-08-10 – 2015-08-11 (×3): 30 mg via ORAL
  Filled 2015-08-10 (×4): qty 0.5

## 2015-08-10 MED ORDER — PANTOPRAZOLE SODIUM 40 MG PO TBEC
40.0000 mg | DELAYED_RELEASE_TABLET | Freq: Two times a day (BID) | ORAL | Status: DC
  Administered 2015-08-10 – 2015-08-11 (×2): 40 mg via ORAL
  Filled 2015-08-10 (×2): qty 1

## 2015-08-10 MED ORDER — SUGAMMADEX SODIUM 200 MG/2ML IV SOLN
INTRAVENOUS | Status: DC | PRN
  Administered 2015-08-10: 200 mg via INTRAVENOUS

## 2015-08-10 MED ORDER — FENTANYL CITRATE (PF) 100 MCG/2ML IJ SOLN
INTRAMUSCULAR | Status: DC | PRN
  Administered 2015-08-10: 100 ug via INTRAVENOUS
  Administered 2015-08-10 (×2): 25 ug via INTRAVENOUS

## 2015-08-10 MED ORDER — HYDROMORPHONE HCL 1 MG/ML IJ SOLN: 0.2500 mg | INTRAMUSCULAR | Status: DC | PRN

## 2015-08-10 MED ORDER — NORTRIPTYLINE HCL 25 MG PO CAPS
100.0000 mg | ORAL_CAPSULE | Freq: Every morning | ORAL | Status: DC
  Administered 2015-08-11: 100 mg via ORAL
  Filled 2015-08-10: qty 4

## 2015-08-10 MED ORDER — OXYCODONE HCL 5 MG PO TABS
5.0000 mg | ORAL_TABLET | ORAL | Status: DC | PRN
  Administered 2015-08-10 (×2): 5 mg via ORAL
  Filled 2015-08-10 (×2): qty 1

## 2015-08-10 MED ORDER — MEPERIDINE HCL 50 MG/ML IJ SOLN: 6.2500 mg | INTRAMUSCULAR | Status: DC | PRN

## 2015-08-10 MED ORDER — DEXTROSE 5 % IV SOLN
2.0000 g | INTRAVENOUS | Status: DC
  Filled 2015-08-10: qty 2

## 2015-08-10 MED ORDER — ONDANSETRON HCL 4 MG/2ML IJ SOLN
4.0000 mg | Freq: Once | INTRAMUSCULAR | Status: DC | PRN
Start: 2015-08-10 — End: 2015-08-10

## 2015-08-10 MED ORDER — HYDROCHLOROTHIAZIDE 12.5 MG PO CAPS
12.5000 mg | ORAL_CAPSULE | Freq: Every day | ORAL | Status: DC
  Administered 2015-08-11: 12.5 mg via ORAL
  Filled 2015-08-10: qty 1

## 2015-08-10 MED ORDER — PROPOFOL 10 MG/ML IV BOLUS
INTRAVENOUS | Status: DC | PRN
  Administered 2015-08-10: 130 mg via INTRAVENOUS

## 2015-08-10 MED ORDER — METRONIDAZOLE IN NACL 5-0.79 MG/ML-% IV SOLN
500.0000 mg | Freq: Once | INTRAVENOUS | Status: AC
  Administered 2015-08-10: 500 mg via INTRAVENOUS
  Filled 2015-08-10: qty 100

## 2015-08-10 MED ORDER — ZOLPIDEM TARTRATE 5 MG PO TABS: 5.0000 mg | ORAL_TABLET | Freq: Every evening | ORAL | Status: DC | PRN

## 2015-08-10 MED ORDER — DIPHENHYDRAMINE HCL 25 MG PO CAPS: 25.0000 mg | ORAL_CAPSULE | Freq: Four times a day (QID) | ORAL | Status: DC | PRN

## 2015-08-10 MED ORDER — EPHEDRINE SULFATE 50 MG/ML IJ SOLN
INTRAMUSCULAR | Status: AC
  Filled 2015-08-10: qty 1

## 2015-08-10 MED ORDER — FENTANYL CITRATE (PF) 250 MCG/5ML IJ SOLN
INTRAMUSCULAR | Status: AC
  Filled 2015-08-10: qty 5

## 2015-08-10 MED ORDER — DIPHENHYDRAMINE HCL 50 MG/ML IJ SOLN: 25.0000 mg | Freq: Four times a day (QID) | INTRAMUSCULAR | Status: DC | PRN

## 2015-08-10 MED ORDER — BUPIVACAINE HCL (PF) 0.5 % IJ SOLN
INTRAMUSCULAR | Status: AC
  Filled 2015-08-10: qty 30

## 2015-08-10 MED ORDER — ONDANSETRON HCL 4 MG/2ML IJ SOLN: 4.0000 mg | Freq: Three times a day (TID) | INTRAMUSCULAR | Status: DC | PRN

## 2015-08-10 MED ORDER — METRONIDAZOLE IN NACL 5-0.79 MG/ML-% IV SOLN
500.0000 mg | Freq: Three times a day (TID) | INTRAVENOUS | Status: DC
  Administered 2015-08-10 – 2015-08-11 (×2): 500 mg via INTRAVENOUS
  Filled 2015-08-10 (×2): qty 100

## 2015-08-10 MED ORDER — HYDROMORPHONE HCL 1 MG/ML IJ SOLN
0.5000 mg | INTRAMUSCULAR | Status: DC | PRN
  Administered 2015-08-10: 1 mg via INTRAVENOUS
  Filled 2015-08-10: qty 1

## 2015-08-10 MED ORDER — ONDANSETRON HCL 4 MG/2ML IJ SOLN
INTRAMUSCULAR | Status: DC | PRN
  Administered 2015-08-10: 4 mg via INTRAVENOUS

## 2015-08-10 MED ORDER — ONDANSETRON HCL 4 MG/2ML IJ SOLN: 4.0000 mg | Freq: Four times a day (QID) | INTRAMUSCULAR | Status: DC | PRN

## 2015-08-10 MED ORDER — LIDOCAINE HCL (CARDIAC) 20 MG/ML IV SOLN
INTRAVENOUS | Status: AC
  Filled 2015-08-10: qty 5

## 2015-08-10 MED ORDER — ROCURONIUM BROMIDE 100 MG/10ML IV SOLN
INTRAVENOUS | Status: DC | PRN
  Administered 2015-08-10: 10 mg via INTRAVENOUS
  Administered 2015-08-10: 30 mg via INTRAVENOUS

## 2015-08-10 MED ORDER — METRONIDAZOLE IN NACL 5-0.79 MG/ML-% IV SOLN
500.0000 mg | Freq: Three times a day (TID) | INTRAVENOUS | Status: DC
  Administered 2015-08-10: 500 mg via INTRAVENOUS
  Filled 2015-08-10: qty 100

## 2015-08-10 MED ORDER — MIDAZOLAM HCL 2 MG/2ML IJ SOLN
INTRAMUSCULAR | Status: AC
  Filled 2015-08-10: qty 2

## 2015-08-10 MED ORDER — BUPIVACAINE HCL (PF) 0.5 % IJ SOLN
INTRAMUSCULAR | Status: DC | PRN
  Administered 2015-08-10: 10 mL

## 2015-08-10 MED ORDER — EPHEDRINE SULFATE 50 MG/ML IJ SOLN
INTRAMUSCULAR | Status: DC | PRN
  Administered 2015-08-10: 5 mg via INTRAVENOUS
  Administered 2015-08-10 (×2): 10 mg via INTRAVENOUS

## 2015-08-10 MED ORDER — LIP MEDEX EX OINT
TOPICAL_OINTMENT | CUTANEOUS | Status: AC
  Administered 2015-08-10: 1
  Filled 2015-08-10: qty 7

## 2015-08-10 MED ORDER — MENTHOL 3 MG MT LOZG
1.0000 | LOZENGE | OROMUCOSAL | Status: DC | PRN
  Administered 2015-08-11: 3 mg via ORAL
  Filled 2015-08-10: qty 9

## 2015-08-10 SURGICAL SUPPLY — 43 items
APPLIER CLIP 5 13 M/L LIGAMAX5 (MISCELLANEOUS)
APPLIER CLIP ROT 10 11.4 M/L (STAPLE)
BENZOIN TINCTURE PRP APPL 2/3 (GAUZE/BANDAGES/DRESSINGS) ×2 IMPLANT
CHLORAPREP W/TINT 26ML (MISCELLANEOUS) ×2 IMPLANT
CLIP APPLIE 5 13 M/L LIGAMAX5 (MISCELLANEOUS) IMPLANT
CLIP APPLIE ROT 10 11.4 M/L (STAPLE) IMPLANT
COVER SURGICAL LIGHT HANDLE (MISCELLANEOUS) ×2 IMPLANT
CUTTER FLEX LINEAR 45M (STAPLE) ×2 IMPLANT
DECANTER SPIKE VIAL GLASS SM (MISCELLANEOUS) ×2 IMPLANT
DRAIN CHANNEL 19F RND (DRAIN) IMPLANT
DRAPE LAPAROSCOPIC ABDOMINAL (DRAPES) ×2 IMPLANT
DRSG TEGADERM 2-3/8X2-3/4 SM (GAUZE/BANDAGES/DRESSINGS) ×6 IMPLANT
ELECT REM PT RETURN 9FT ADLT (ELECTROSURGICAL) ×2
ELECTRODE REM PT RTRN 9FT ADLT (ELECTROSURGICAL) ×1 IMPLANT
ENDOLOOP SUT PDS II  0 18 (SUTURE)
ENDOLOOP SUT PDS II 0 18 (SUTURE) IMPLANT
EVACUATOR SILICONE 100CC (DRAIN) IMPLANT
GAUZE SPONGE 2X2 8PLY STRL LF (GAUZE/BANDAGES/DRESSINGS) ×1 IMPLANT
GLOVE ECLIPSE 8.0 STRL XLNG CF (GLOVE) ×2 IMPLANT
GLOVE INDICATOR 8.0 STRL GRN (GLOVE) ×2 IMPLANT
GOWN STRL REUS W/TWL XL LVL3 (GOWN DISPOSABLE) ×4 IMPLANT
IRRIG SUCT STRYKERFLOW 2 WTIP (MISCELLANEOUS) ×2
IRRIGATION SUCT STRKRFLW 2 WTP (MISCELLANEOUS) ×1 IMPLANT
KIT BASIN OR (CUSTOM PROCEDURE TRAY) ×2 IMPLANT
POUCH SPECIMEN RETRIEVAL 10MM (ENDOMECHANICALS) ×2 IMPLANT
RELOAD 45 VASCULAR/THIN (ENDOMECHANICALS) IMPLANT
RELOAD STAPLE TA45 3.5 REG BLU (ENDOMECHANICALS) IMPLANT
SCISSORS LAP 5X35 DISP (ENDOMECHANICALS) ×2 IMPLANT
SHEARS HARMONIC ACE PLUS 36CM (ENDOMECHANICALS) ×2 IMPLANT
SLEEVE XCEL OPT CAN 5 100 (ENDOMECHANICALS) ×2 IMPLANT
SOLUTION ANTI FOG 6CC (MISCELLANEOUS) ×2 IMPLANT
SPONGE GAUZE 2X2 STER 10/PKG (GAUZE/BANDAGES/DRESSINGS) ×1
STRIP CLOSURE SKIN 1/2X4 (GAUZE/BANDAGES/DRESSINGS) ×2 IMPLANT
SUT ETHILON 3 0 PS 1 (SUTURE) IMPLANT
SUT MNCRL AB 4-0 PS2 18 (SUTURE) ×2 IMPLANT
TOWEL OR 17X26 10 PK STRL BLUE (TOWEL DISPOSABLE) ×2 IMPLANT
TOWEL OR NON WOVEN STRL DISP B (DISPOSABLE) ×2 IMPLANT
TRAY FOLEY W/METER SILVER 14FR (SET/KITS/TRAYS/PACK) ×2 IMPLANT
TRAY FOLEY W/METER SILVER 16FR (SET/KITS/TRAYS/PACK) IMPLANT
TRAY LAPAROSCOPIC (CUSTOM PROCEDURE TRAY) ×2 IMPLANT
TROCAR BLADELESS OPT 5 100 (ENDOMECHANICALS) ×2 IMPLANT
TROCAR XCEL BLUNT TIP 100MML (ENDOMECHANICALS) ×2 IMPLANT
TUBING INSUF HEATED (TUBING) ×2 IMPLANT

## 2015-08-10 NOTE — H&P (Signed)
Patricia Walls is an 60 y.o. female.   Chief Complaint: Right-sided abdominal pain HPI: Patient presented to the emergency room last evening. She reported right-sided abdominal pain that started around 4 AM on 08/09/15. Was a cramping pain that radiated toward the umbilicus and woke her from her sleep. Pain was worse with movement or palpation. Initially she had no issues with nausea vomiting or diarrhea. Workup in the emergency room shows low-grade temperature 99.1 vital signs were stable. Labs shows a mildly elevated glucose of 114 otherwise normal CMP. WBC was 12.7. Urinalysis was normal. A CT scan of the abdomen and pelvis was obtained. There was a question of a small dependent gallstone large and small bowel was unremarkable. The appendix was dilated and contain intraluminal appendicolith. There is no extraluminal air or abscess or perforation. She is diagnosed with uncomplicated appendicitis and we were asked to see.  Past Medical History:  Diagnosis Date  . Depression 08/10/2015  . Esophageal dysmotility 08/10/2015   Followed and in a study at [facility] - Dr. Lacinda Axon  . Essential hypertension 08/10/2015  . Fibromyalgia   . Lyme disease  2010     Past Surgical History:  Procedure Laterality Date  . DIAGNOSTIC LAPAROSCOPY N/A    Fertility evaluation  . extraction of wisdom teeth      Family History  Problem Relation Age of Onset  . Hypertension Other   . Hyperlipidemia Other    Social History:  reports that she has never smoked. She has never used smokeless tobacco. She reports that she does not drink alcohol or use drugs.  Allergies: No Known Allergies  Medications Prior to Admission  Medication Sig Dispense Refill  . amLODipine (NORVASC) 10 MG tablet Take 10 mg by mouth daily with breakfast.    . calcium-vitamin D (OSCAL WITH D) 500-200 MG-UNIT tablet Take 1 tablet by mouth daily.    . cloNIDine (CATAPRES) 0.2 MG tablet Take 0.2 mg by mouth every evening.    . gabapentin  (NEURONTIN) 300 MG capsule Take 300 mg by mouth 3 (three) times daily.    . hydrochlorothiazide (MICROZIDE) 12.5 MG capsule Take 12.5 mg by mouth daily with breakfast.    . lisinopril (PRINIVIL,ZESTRIL) 40 MG tablet Take 40 mg by mouth every evening.    . Multiple Vitamin (MULTIVITAMIN WITH MINERALS) TABS tablet Take 1 tablet by mouth daily with breakfast.    . nortriptyline (PAMELOR) 50 MG capsule Take 100 mg by mouth every morning.    . ondansetron (ZOFRAN-ODT) 4 MG disintegrating tablet Take 4 mg by mouth once.    . pantoprazole (PROTONIX) 40 MG tablet Take 40 mg by mouth 2 (two) times daily.    . Probiotic Product (DIGESTIVE ADVANTAGE PO) Take 2 tablets by mouth daily.    Marland Kitchen pyridostigmine (MESTINON) 60 MG tablet Take 30 mg by mouth 3 (three) times daily.    . ranitidine (ZANTAC) 150 MG tablet Take 150 mg by mouth 2 (two) times daily.    . simvastatin (ZOCOR) 20 MG tablet Take 20 mg by mouth every evening.    Marland Kitchen tiZANidine (ZANAFLEX) 4 MG tablet Take 4 mg by mouth at bedtime.    Marland Kitchen zolpidem (AMBIEN) 10 MG tablet Take 5-10 mg by mouth at bedtime as needed for sleep.      Results for orders placed or performed during the hospital encounter of 08/09/15 (from the past 48 hour(s))  Urinalysis, Routine w reflex microscopic     Status: Abnormal   Collection Time:  08/09/15  8:25 PM  Result Value Ref Range   Color, Urine YELLOW YELLOW   APPearance CLOUDY (A) CLEAR   Specific Gravity, Urine 1.014 1.005 - 1.030   pH 6.0 5.0 - 8.0   Glucose, UA NEGATIVE NEGATIVE mg/dL   Hgb urine dipstick NEGATIVE NEGATIVE   Bilirubin Urine NEGATIVE NEGATIVE   Ketones, ur NEGATIVE NEGATIVE mg/dL   Protein, ur NEGATIVE NEGATIVE mg/dL   Nitrite NEGATIVE NEGATIVE   Leukocytes, UA NEGATIVE NEGATIVE    Comment: MICROSCOPIC NOT DONE ON URINES WITH NEGATIVE PROTEIN, BLOOD, LEUKOCYTES, NITRITE, OR GLUCOSE <1000 mg/dL.  Lipase, blood     Status: None   Collection Time: 08/09/15  8:36 PM  Result Value Ref Range   Lipase  28 11 - 51 U/L  Comprehensive metabolic panel     Status: Abnormal   Collection Time: 08/09/15  8:36 PM  Result Value Ref Range   Sodium 141 135 - 145 mmol/L   Potassium 3.6 3.5 - 5.1 mmol/L   Chloride 104 101 - 111 mmol/L   CO2 29 22 - 32 mmol/L   Glucose, Bld 114 (H) 65 - 99 mg/dL   BUN 9 6 - 20 mg/dL   Creatinine, Ser 0.77 0.44 - 1.00 mg/dL   Calcium 9.3 8.9 - 10.3 mg/dL   Total Protein 7.9 6.5 - 8.1 g/dL   Albumin 4.3 3.5 - 5.0 g/dL   AST 21 15 - 41 U/L   ALT 14 14 - 54 U/L   Alkaline Phosphatase 67 38 - 126 U/L   Total Bilirubin 0.7 0.3 - 1.2 mg/dL   GFR calc non Af Amer >60 >60 mL/min   GFR calc Af Amer >60 >60 mL/min    Comment: (NOTE) The eGFR has been calculated using the CKD EPI equation. This calculation has not been validated in all clinical situations. eGFR's persistently <60 mL/min signify possible Chronic Kidney Disease.    Anion gap 8 5 - 15  CBC     Status: Abnormal   Collection Time: 08/09/15  8:36 PM  Result Value Ref Range   WBC 12.7 (H) 4.0 - 10.5 K/uL   RBC 3.89 3.87 - 5.11 MIL/uL   Hemoglobin 11.9 (L) 12.0 - 15.0 g/dL   HCT 35.7 (L) 36.0 - 46.0 %   MCV 91.8 78.0 - 100.0 fL   MCH 30.6 26.0 - 34.0 pg   MCHC 33.3 30.0 - 36.0 g/dL   RDW 13.9 11.5 - 15.5 %   Platelets 279 150 - 400 K/uL   Ct Abdomen Pelvis W Contrast  Result Date: 08/10/2015 CLINICAL DATA:  Abdominal pain, onset today. Right lower quadrant pain. EXAM: CT ABDOMEN AND PELVIS WITH CONTRAST TECHNIQUE: Multidetector CT imaging of the abdomen and pelvis was performed using the standard protocol following bolus administration of intravenous contrast. CONTRAST:  158m ISOVUE-300 IOPAMIDOL (ISOVUE-300) INJECTION 61% COMPARISON:  None. FINDINGS: Lower chest: The included lung bases are clear. Heart is upper limits of normal in size. Liver: Homogeneous in density.  No focal lesion. Hepatobiliary: Questionable small dependent gallstones. Gallbladder physiologically distended. No biliary dilatation.  Pancreas: No ductal dilatation or inflammation. Spleen: Normal in size.  Small granuloma inferiorly. Adrenal glands: No nodule. Kidneys: Symmetric renal enhancement. No hydronephrosis. No perinephric edema. Stomach/Bowel: Stomach physiologically distended. There are no dilated or thickened small bowel loops. Moderate to large volume of stool throughout the colon without colonic wall thickening. Appendix: Dilated, fluid-filled containing intraluminal appendicoliths. There is wall enhancement and adjacent periappendiceal fat stranding. No extraluminal air,  abscess or perforation. Vascular/Lymphatic: No retroperitoneal adenopathy. Abdominal aorta is normal in caliber. Reproductive: Enhancing focus in the uterine fundus likely a fibroid. No adnexal mass. Bladder: Physiologically distended.  No wall thickening. Other: No free air, free fluid, or intra-abdominal fluid collection. Fat lobule in the pelvis just posterior to the bladder measuring 12 mm with surrounding soft tissue density may be sequela of fat necrosis, no acute inflammation. No suspicious characteristics. Musculoskeletal: There are no acute or suspicious osseous abnormalities. IMPRESSION: Uncomplicated acute appendicitis. Electronically Signed   By: Jeb Levering M.D.   On: 08/10/2015 00:28    Review of Systems  Constitutional: Negative for chills, diaphoresis, fever, malaise/fatigue and weight loss.  HENT:       She has ongoing issues with swallowing and is on Medications for this at followed [facility] for this.  Eyes: Negative.   Respiratory: Negative.   Cardiovascular: Positive for leg swelling (if she is up on her feet for some time.).  Gastrointestinal: Positive for abdominal pain (pain started on the right side, more lateral area and moved to RLQ). Negative for blood in stool, constipation, diarrhea, heartburn, melena, nausea and vomiting.  Genitourinary: Negative for dysuria, flank pain (she has had nephrolithiasis in the  past.), frequency, hematuria and urgency.       Feels like she never completely empties since menopause.   Musculoskeletal:       Fibromyalgia affects her legs and is chronic  Skin: Negative.   Neurological: Negative.  Negative for weakness.  Endo/Heme/Allergies: Negative.   Psychiatric/Behavioral: Positive for depression (since the death of her 65 yearl old son).    Blood pressure 131/71, pulse 93, temperature 98.3 F (36.8 C), temperature source Oral, resp. rate 15, height 5' 6.5" (1.689 m), weight 63.5 kg (140 lb), SpO2 98 %. Physical Exam   Assessment/Plan Acute appendicitis Hypertension Esophageal dysmotility Fibromyalgia - leg pain Hx of depression Hx of nephrolithiasis   Plan:  Pt is for surgery later today.  She is NPO, on IV antibiotics and fluid hydration.  She and her husband are aware of the risk and benefits of surgery.    Duc Crocket, PA-C 08/10/2015, 9:18 AM

## 2015-08-10 NOTE — Anesthesia Preprocedure Evaluation (Addendum)
Anesthesia Evaluation  Patient identified by MRN, date of birth, ID band Patient awake    Reviewed: Allergy & Precautions, NPO status , Patient's Chart, lab work & pertinent test results  Airway Mallampati: I  TM Distance: >3 FB Neck ROM: Full    Dental   Pulmonary    Pulmonary exam normal        Cardiovascular Normal cardiovascular exam     Neuro/Psych Depression    GI/Hepatic   Endo/Other    Renal/GU      Musculoskeletal   Abdominal   Peds  Hematology   Anesthesia Other Findings   Reproductive/Obstetrics                             Anesthesia Physical Anesthesia Plan  ASA: II and emergent  Anesthesia Plan: General   Post-op Pain Management:    Induction: Intravenous  Airway Management Planned: Oral ETT  Additional Equipment:   Intra-op Plan:   Post-operative Plan: Extubation in OR  Informed Consent: I have reviewed the patients History and Physical, chart, labs and discussed the procedure including the risks, benefits and alternatives for the proposed anesthesia with the patient or authorized representative who has indicated his/her understanding and acceptance.     Plan Discussed with: CRNA and Surgeon  Anesthesia Plan Comments:         Anesthesia Quick Evaluation

## 2015-08-10 NOTE — Op Note (Signed)
Appendectomy, Lap, Procedure Note  Pre-operative Diagnosis: Acute retrocecal appendicitis  Post-operative Diagnosis: Same  Procedure:  Laparoscopic appendectomy  Surgeon:  Avel Peace, M.D.  Anesthesia:  General  Indications:  This is a 61 year old female who developed RLQ pain that progressively worsened.  She presented to the ED late last night and was diagnosed with acute appendicitis.  She was admitted, started on IV antibiotics and now presents for the above procedure.   Procedure Details   She was brought to the operating room, placed in the supine position and general anesthesia was induced, along with placement of orogastric tube, SCDs, and a Foley catheter. A timeout was performed. The abdomen was prepped and draped in a sterile fashion. A small infraumbilical incision was made through the skin, subcutaneous tissue, fascia, and peritoneum entering the peritoneal cavity under direct vision.  A pursestring suture was passed around the fascia with a 0 Vicryl.  The Hasson was introduced into the peritoneal cavity and the tails of the suture were used to hold the Hasson in place.   The pneumoperitoneum was then established to steady pressure of 15 mmHg.   The laparoscope was introduced and there was no evidence of bleeding or underlying organ injury. Additional 5 mm cannulas then placed in the left lower quadrant of the abdomen and the right upper quadrant region under direct visualization. A careful evaluation of the entire abdomen was carried out. The patient was placed in Trendelenburg and left lateral decubitus position.  The patient was found to have an enlarged and inflamed appendix that was retrocecal and easily exposed.. There was no evidence of perforation.  The appendix was carefully mobilized. The mesoappendix was was divided with the harmonic scalpel.   The appendix was amputated off the cecum, with a small cuff of cecum, using an endo-GIA stapler.  The appendix was placed in  a retrieval bag and removed through the subumbilical port incision.  There was no evidence of bleeding, leakage, or complication after division of the appendix. Copious irrigation was  performed and irrigant fluid suctioned from the abdomen as much as possible.  The umbilical trocar was removed and the  port site fascia was closed via the purse string suture under laparoscopic vision. There was no residual palpable fascial defect.  The remaining trocars were removed and all  trocar site skin wounds were closed with 4-0 Monocryl subcuticular sutures.  Steri strips and sterile dressings were applied.    She tolerated the procedure well without any apparent complications and was taken to the PACU in satisfactory condition.  Instrument, sponge, and needle counts were correct at the conclusion of the case.    Estimated Blood Loss:  less than 100 mL                Specimens: appendix         Complications:  None; patient tolerated the procedure well.         Disposition: PACU - hemodynamically stable.         Condition: stable

## 2015-08-10 NOTE — Anesthesia Procedure Notes (Signed)
Procedure Name: Intubation Date/Time: 08/10/2015 1:21 PM Performed by: Jarvis Newcomer A Pre-anesthesia Checklist: Patient identified, Emergency Drugs available, Suction available and Patient being monitored Patient Re-evaluated:Patient Re-evaluated prior to inductionOxygen Delivery Method: Circle system utilized Preoxygenation: Pre-oxygenation with 100% oxygen Intubation Type: IV induction, Rapid sequence and Cricoid Pressure applied Laryngoscope Size: Mac and 3 Grade View: Grade I Tube type: Oral Tube size: 7.5 mm Number of attempts: 1 Airway Equipment and Method: Stylet Placement Confirmation: ETT inserted through vocal cords under direct vision,  positive ETCO2 and breath sounds checked- equal and bilateral Secured at: 22 cm Tube secured with: Tape Dental Injury: Teeth and Oropharynx as per pre-operative assessment  Comments: Rapid sequence. Cricoid pressure held by Dr. Michelle Piper. No mask prior to DL. Atraumatic intubation by Antonietta Breach.

## 2015-08-10 NOTE — Anesthesia Postprocedure Evaluation (Signed)
Anesthesia Post Note  Patient: Patricia Walls  Procedure(s) Performed: Procedure(s) (LRB): APPENDECTOMY LAPAROSCOPIC (N/A)  Patient location during evaluation: PACU Anesthesia Type: General Level of consciousness: awake and alert Pain management: pain level controlled Vital Signs Assessment: post-procedure vital signs reviewed and stable Respiratory status: spontaneous breathing, nonlabored ventilation, respiratory function stable and patient connected to nasal cannula oxygen Cardiovascular status: blood pressure returned to baseline and stable Postop Assessment: no signs of nausea or vomiting Anesthetic complications: no    Last Vitals:  Vitals:   08/10/15 1708 08/10/15 1745  BP: (!) 145/78 127/77  Pulse:  99  Resp:  16  Temp:  36.6 C    Last Pain:  Vitals:   08/10/15 1745  TempSrc: Oral  PainSc:                  Avital Dancy DAVID

## 2015-08-10 NOTE — Transfer of Care (Signed)
Immediate Anesthesia Transfer of Care Note  Patient: Patricia Walls  Procedure(s) Performed: Procedure(s): APPENDECTOMY LAPAROSCOPIC (N/A)  Patient Location: PACU  Anesthesia Type:General  Level of Consciousness: awake, alert , oriented and patient cooperative  Airway & Oxygen Therapy: Patient Spontanous Breathing and Patient connected to face mask oxygen  Post-op Assessment: Report given to RN, Post -op Vital signs reviewed and stable and Patient moving all extremities  Post vital signs: Reviewed and stable  Last Vitals:  Vitals:   08/10/15 0345 08/10/15 0502  BP: 130/79 131/71  Pulse: 81 93  Resp:  15  Temp: 37.3 C 36.8 C    Last Pain:  Vitals:   08/10/15 1028  TempSrc:   PainSc: 2       Patients Stated Pain Goal: 2 (08/10/15 0935)  Complications: No apparent anesthesia complications

## 2015-08-11 MED ORDER — IBUPROFEN 200 MG PO TABS: ORAL_TABLET | ORAL | 0 refills | Status: AC

## 2015-08-11 MED ORDER — OXYCODONE HCL 5 MG PO TABS: 5.0000 mg | ORAL_TABLET | ORAL | 0 refills | Status: AC | PRN

## 2015-08-11 MED ORDER — ACETAMINOPHEN 325 MG PO TABS: 650.0000 mg | ORAL_TABLET | ORAL | 2 refills | Status: AC | PRN

## 2015-08-11 NOTE — Discharge Instructions (Signed)
Laparoscopic Appendectomy, Adult, Care After °Refer to this sheet in the next few weeks. These instructions provide you with information on caring for yourself after your procedure. Your caregiver may also give you more specific instructions. Your treatment has been planned according to current medical practices, but problems sometimes occur. Call your caregiver if you have any problems or questions after your procedure. °HOME CARE INSTRUCTIONS °· Do not drive while taking narcotic pain medicines. °· Use stool softener if you become constipated from your pain medicines. °· Change your bandages (dressings) as directed. °· Keep your wounds clean and dry. You may wash the wounds gently with soap and water. Gently pat the wounds dry with a clean towel. °· Do not take baths, swim, or use hot tubs for 10 days, or as instructed by your caregiver. °· Only take over-the-counter or prescription medicines for pain, discomfort, or fever as directed by your caregiver. °· You may continue your normal diet as directed. °· Do not lift more than 10 pounds (4.5 kg) or play contact sports for 3 weeks, or as directed. °· Slowly increase your activity after surgery. °· Take deep breaths to avoid getting a lung infection (pneumonia). °SEEK MEDICAL CARE IF: °· You have redness, swelling, or increasing pain in your wounds. °· You have pus coming from your wounds. °· You have drainage from a wound that lasts longer than 1 day. °· You notice a bad smell coming from the wounds or dressing. °· Your wound edges break open after stitches (sutures) have been removed. °· You notice increasing pain in the shoulders (shoulder strap areas) or near your shoulder blades. °· You develop dizzy episodes or fainting while standing. °· You develop shortness of breath. °· You develop persistent nausea or vomiting. °· You cannot control your bowel functions or lose your appetite. °· You develop diarrhea. °SEEK IMMEDIATE MEDICAL CARE IF:  °· You have a  fever. °· You develop a rash. °· You have difficulty breathing or sharp pains in your chest. °· You develop any reaction or side effects to medicines given. °MAKE SURE YOU: °· Understand these instructions. °· Will watch your condition. °· Will get help right away if you are not doing well or get worse. °  °This information is not intended to replace advice given to you by your health care provider. Make sure you discuss any questions you have with your health care provider. °  °Document Released: 12/26/2004 Document Revised: 05/12/2014 Document Reviewed: 06/15/2014 °Elsevier Interactive Patient Education ©2016 [employer] ° °CCS ______[facility] °LAPAROSCOPIC SURGERY: POST OP INSTRUCTIONS °Always review your discharge instruction sheet given to you by the facility where your surgery was performed. °IF YOU HAVE DISABILITY OR FAMILY LEAVE FORMS, YOU MUST BRING THEM TO THE OFFICE FOR PROCESSING.   °DO NOT GIVE THEM TO YOUR DOCTOR. ° °1. A prescription for pain medication may be given to you upon discharge.  Take your pain medication as prescribed, if needed.  If narcotic pain medicine is not needed, then you may take acetaminophen (Tylenol) or ibuprofen (Advil) as needed. °2. Take your usually prescribed medications unless otherwise directed. °3. If you need a refill on your pain medication, please contact your pharmacy.  They will contact our office to request authorization. Prescriptions will not be filled after 5pm or on week-ends. °4. You should follow a light diet the first few days after arrival home, such as soup and crackers, etc.  Be sure to include lots of fluids daily. °5. Most   patients will experience some swelling and bruising in the area of the incisions.  Ice packs will help.  Swelling and bruising can take several days to resolve.  °6. It is common to experience some constipation if taking pain medication after surgery.  Increasing fluid intake and taking a stool softener (such as  Colace) will usually help or prevent this problem from occurring.  A mild laxative (Milk of Magnesia or Miralax) should be taken according to package instructions if there are no bowel movements after 48 hours. °7. Unless discharge instructions indicate otherwise, you may remove your bandages 24-48 hours after surgery, and you may shower at that time.  You may have steri-strips (small skin tapes) in place directly over the incision.  These strips should be left on the skin for 7-10 days.  If your surgeon used skin glue on the incision, you may shower in 24 hours.  The glue will flake off over the next 2-3 weeks.  Any sutures or staples will be removed at the office during your follow-up visit. °8. ACTIVITIES:  You may resume regular (light) daily activities beginning the next day--such as daily self-care, walking, climbing stairs--gradually increasing activities as tolerated.  You may have sexual intercourse when it is comfortable.  Refrain from any heavy lifting or straining until approved by your doctor. °a. You may drive when you are no longer taking prescription pain medication, you can comfortably wear a seatbelt, and you can safely maneuver your car and apply brakes. °b. RETURN TO WORK:  __________________________________________________________ °9. You should see your doctor in the office for a follow-up appointment approximately 2-3 weeks after your surgery.  Make sure that you call for this appointment within a day or two after you arrive home to insure a convenient appointment time. °10. OTHER INSTRUCTIONS: __________________________________________________________________________________________________________________________ __________________________________________________________________________________________________________________________ °WHEN TO CALL YOUR DOCTOR: °1. Fever over 101.0 °2. Inability to urinate °3. Continued bleeding from incision. °4. Increased pain, redness, or drainage from the  incision. °5. Increasing abdominal pain ° °The clinic staff is available to answer your questions during regular business hours.  Please don’t hesitate to call and ask to speak to one of the nurses for clinical concerns.  If you have a medical emergency, go to the nearest emergency room or call 911.  A surgeon from [facility] is always on call at the hospital. °[address], [address], [place], [place]  [address] ? [address] [address], [place], [place]   [address] °[phone] ? [phone] ? FAX [phone] °Web site: [email or web address] ° °

## 2015-08-11 NOTE — Progress Notes (Signed)
1 Day Post-Op  Subjective: Patient's up walking the halls 8 her regular breakfast voiding without difficulty. Sites all look good. She still little sore but otherwise doing quite well and ready for discharge.  Objective: Vital signs in last 24 hours: Temp:  [97.8 F (36.6 C)-98.4 F (36.9 C)] 98.4 F (36.9 C) (08/02 0551) Pulse Rate:  [70-103] 70 (08/02 0800) Resp:  [12-18] 15 (08/02 0551) BP: (92-145)/(51-78) 116/67 (08/02 0807) SpO2:  [94 %-100 %] 95 % (08/02 0551) Last BM Date: 08/09/15 1220 by mouth intake urine 2050 Afebrile vital signs are stable No labs/no radiology exams Intake/Output from previous day: 08/01 0701 - 08/02 0700 In: 4385 [P.O.:1220; I.V.:3165] Out: 2075 [Urine:2050; Blood:25] Intake/Output this shift: Total I/O In: 240 [P.O.:240] Out: 600 [Urine:600]  General appearance: alert, cooperative and no distress Resp: clear to auscultation bilaterally GI: Soft, nontender, sore sites all look good, no distention.  Lab Results:   Recent Labs  08/09/15 2036  WBC 12.7*  HGB 11.9*  HCT 35.7*  PLT 279    BMET  Recent Labs  08/09/15 2036  NA 141  K 3.6  CL 104  CO2 29  GLUCOSE 114*  BUN 9  CREATININE 0.77  CALCIUM 9.3   PT/INR No results for input(s): LABPROT, INR in the last 72 hours.   Recent Labs Lab 08/09/15 2036  AST 21  ALT 14  ALKPHOS 67  BILITOT 0.7  PROT 7.9  ALBUMIN 4.3     Lipase     Component Value Date/Time   LIPASE 28 08/09/2015 2036     Studies/Results: Ct Abdomen Pelvis W Contrast  Result Date: 08/10/2015 CLINICAL DATA:  Abdominal pain, onset today. Right lower quadrant pain. EXAM: CT ABDOMEN AND PELVIS WITH CONTRAST TECHNIQUE: Multidetector CT imaging of the abdomen and pelvis was performed using the standard protocol following bolus administration of intravenous contrast. CONTRAST:  ISOVUE-300 IOPAMIDOL (ISOVUE-300) INJECTION 61% COMPARISON:  None. FINDINGS: Lower chest: The included lung bases are clear.  Heart is upper limits of normal in size. Liver: Homogeneous in density.  No focal lesion. Hepatobiliary: Questionable small dependent gallstones. Gallbladder physiologically distended. No biliary dilatation. Pancreas: No ductal dilatation or inflammation. Spleen: Normal in size.  Small granuloma inferiorly. Adrenal glands: No nodule. Kidneys: Symmetric renal enhancement. No hydronephrosis. No perinephric edema. Stomach/Bowel: Stomach physiologically distended. There are no dilated or thickened small bowel loops. Moderate to large volume of stool throughout the colon without colonic wall thickening. Appendix: Dilated, fluid-filled containing intraluminal appendicoliths. There is wall enhancement and adjacent periappendiceal fat stranding. No extraluminal air, abscess or perforation. Vascular/Lymphatic: No retroperitoneal adenopathy. Abdominal aorta is normal in caliber. Reproductive: Enhancing focus in the uterine fundus likely a fibroid. No adnexal mass. Bladder: Physiologically distended.  No wall thickening. Other: No free air, free fluid, or intra-abdominal fluid collection. Fat lobule in the pelvis just posterior to the bladder measuring 12 mm with surrounding soft tissue density may be sequela of fat necrosis, no acute inflammation. No suspicious characteristics. Musculoskeletal: There are no acute or suspicious osseous abnormalities. IMPRESSION: Uncomplicated acute appendicitis. Electronically Signed   By: Rubye Oaks M.D.   On: 08/10/2015 00:28    Medications: . amLODipine  10 mg Oral Q breakfast  . metronidazole  500 mg Intravenous Q8H   And  . cefTRIAXone (ROCEPHIN)  IV  2 g Intravenous Q24H  . cloNIDine  0.2 mg Oral QPM  . gabapentin  300 mg Oral TID  . heparin subcutaneous  5,000 Units Subcutaneous Q8H  .  hydrochlorothiazide  12.5 mg Oral Q breakfast  . lisinopril  40 mg Oral QPM  . nortriptyline  100 mg Oral q morning - 10a  . pantoprazole  40 mg Oral BID  . pyridostigmine  30 mg Oral  TID  . tiZANidine  4 mg Oral QHS   . dextrose 5 % and 0.9 % NaCl with KCl 20 mEq/L 100 mL/hr at 08/11/15 0307   Hypertension Esophageal dysmotility Fibromyalgia - leg pain Hx of depression Hx of nephrolithiasis  Assessment/Plan Acute appendicitis S/P laparoscopic appendectomy, 08/10/15, Dr. Avel Peace FEN: IV fluids/regular diet ID: Flagyl and ceftriaxone day 2 DVT: Heparin/SCD   Plan: Discharge home today     LOS: 1 day    Al Gagen 08/11/2015 [phone]

## 2015-09-28 DIAGNOSIS — Z23 Encounter for immunization: Secondary | ICD-10-CM | POA: Diagnosis not present

## 2015-10-29 DIAGNOSIS — D225 Melanocytic nevi of trunk: Secondary | ICD-10-CM | POA: Diagnosis not present

## 2015-10-29 DIAGNOSIS — L814 Other melanin hyperpigmentation: Secondary | ICD-10-CM | POA: Diagnosis not present

## 2015-10-29 DIAGNOSIS — D1800 Hemangioma unspecified site: Secondary | ICD-10-CM | POA: Diagnosis not present

## 2015-10-29 DIAGNOSIS — L821 Other seborrheic keratosis: Secondary | ICD-10-CM | POA: Diagnosis not present

## 2015-11-16 ENCOUNTER — Other Ambulatory Visit: Payer: Self-pay | Admitting: Family Medicine

## 2015-11-16 DIAGNOSIS — Z1231 Encounter for screening mammogram for malignant neoplasm of breast: Secondary | ICD-10-CM

## 2015-12-17 DIAGNOSIS — D539 Nutritional anemia, unspecified: Secondary | ICD-10-CM | POA: Diagnosis not present

## 2015-12-17 DIAGNOSIS — I10 Essential (primary) hypertension: Secondary | ICD-10-CM | POA: Diagnosis not present

## 2015-12-17 DIAGNOSIS — E559 Vitamin D deficiency, unspecified: Secondary | ICD-10-CM | POA: Diagnosis not present

## 2015-12-17 DIAGNOSIS — E7800 Pure hypercholesterolemia, unspecified: Secondary | ICD-10-CM | POA: Diagnosis not present

## 2015-12-17 DIAGNOSIS — Z0000 Encounter for general adult medical examination without abnormal findings: Secondary | ICD-10-CM | POA: Diagnosis not present

## 2015-12-23 ENCOUNTER — Ambulatory Visit
Admission: RE | Admit: 2015-12-23 | Discharge: 2015-12-23 | Disposition: A | Discharge status: Home / Self Care | Payer: BLUE CROSS/BLUE SHIELD | Source: Ambulatory Visit | Attending: Family Medicine | Admitting: Family Medicine

## 2015-12-23 DIAGNOSIS — Z1231 Encounter for screening mammogram for malignant neoplasm of breast: Secondary | ICD-10-CM

## 2016-01-05 DIAGNOSIS — E2839 Other primary ovarian failure: Secondary | ICD-10-CM | POA: Diagnosis not present

## 2016-01-05 DIAGNOSIS — M810 Age-related osteoporosis without current pathological fracture: Secondary | ICD-10-CM | POA: Diagnosis not present

## 2016-06-15 DIAGNOSIS — M797 Fibromyalgia: Secondary | ICD-10-CM | POA: Diagnosis not present

## 2016-06-15 DIAGNOSIS — R600 Localized edema: Secondary | ICD-10-CM | POA: Diagnosis not present

## 2016-06-15 DIAGNOSIS — F5101 Primary insomnia: Secondary | ICD-10-CM | POA: Diagnosis not present

## 2016-06-15 DIAGNOSIS — D539 Nutritional anemia, unspecified: Secondary | ICD-10-CM | POA: Diagnosis not present

## 2016-06-15 DIAGNOSIS — K3184 Gastroparesis: Secondary | ICD-10-CM | POA: Diagnosis not present

## 2016-06-15 DIAGNOSIS — I10 Essential (primary) hypertension: Secondary | ICD-10-CM | POA: Diagnosis not present

## 2016-06-21 DIAGNOSIS — R1310 Dysphagia, unspecified: Secondary | ICD-10-CM | POA: Diagnosis not present

## 2016-06-21 DIAGNOSIS — R110 Nausea: Secondary | ICD-10-CM | POA: Diagnosis not present

## 2016-06-21 DIAGNOSIS — K3184 Gastroparesis: Secondary | ICD-10-CM | POA: Diagnosis not present

## 2016-10-05 DIAGNOSIS — Z23 Encounter for immunization: Secondary | ICD-10-CM | POA: Diagnosis not present

## 2016-11-02 DIAGNOSIS — L814 Other melanin hyperpigmentation: Secondary | ICD-10-CM | POA: Diagnosis not present

## 2016-11-02 DIAGNOSIS — D225 Melanocytic nevi of trunk: Secondary | ICD-10-CM | POA: Diagnosis not present

## 2016-11-02 DIAGNOSIS — L821 Other seborrheic keratosis: Secondary | ICD-10-CM | POA: Diagnosis not present

## 2016-11-02 DIAGNOSIS — D1801 Hemangioma of skin and subcutaneous tissue: Secondary | ICD-10-CM | POA: Diagnosis not present

## 2016-11-17 ENCOUNTER — Other Ambulatory Visit: Payer: Self-pay | Admitting: Family Medicine

## 2016-11-17 DIAGNOSIS — Z1231 Encounter for screening mammogram for malignant neoplasm of breast: Secondary | ICD-10-CM

## 2016-12-25 ENCOUNTER — Ambulatory Visit
Admission: RE | Admit: 2016-12-25 | Discharge: 2016-12-25 | Disposition: A | Discharge status: Home / Self Care | Payer: BLUE CROSS/BLUE SHIELD | Source: Ambulatory Visit | Attending: Family Medicine | Admitting: Family Medicine

## 2016-12-25 DIAGNOSIS — Z1231 Encounter for screening mammogram for malignant neoplasm of breast: Secondary | ICD-10-CM

## 2016-12-27 DIAGNOSIS — R12 Heartburn: Secondary | ICD-10-CM | POA: Diagnosis not present

## 2017-01-04 DIAGNOSIS — E7800 Pure hypercholesterolemia, unspecified: Secondary | ICD-10-CM | POA: Diagnosis not present

## 2017-01-04 DIAGNOSIS — Z23 Encounter for immunization: Secondary | ICD-10-CM | POA: Diagnosis not present

## 2017-01-04 DIAGNOSIS — K3184 Gastroparesis: Secondary | ICD-10-CM | POA: Diagnosis not present

## 2017-01-04 DIAGNOSIS — D539 Nutritional anemia, unspecified: Secondary | ICD-10-CM | POA: Diagnosis not present

## 2017-01-04 DIAGNOSIS — I10 Essential (primary) hypertension: Secondary | ICD-10-CM | POA: Diagnosis not present

## 2017-01-04 DIAGNOSIS — F5101 Primary insomnia: Secondary | ICD-10-CM | POA: Diagnosis not present

## 2017-01-10 DIAGNOSIS — K603 Anal fistula: Secondary | ICD-10-CM | POA: Diagnosis not present

## 2017-01-15 DIAGNOSIS — K6289 Other specified diseases of anus and rectum: Secondary | ICD-10-CM | POA: Diagnosis not present

## 2017-01-17 DIAGNOSIS — Z1211 Encounter for screening for malignant neoplasm of colon: Secondary | ICD-10-CM | POA: Diagnosis not present

## 2017-01-17 DIAGNOSIS — K5730 Diverticulosis of large intestine without perforation or abscess without bleeding: Secondary | ICD-10-CM | POA: Diagnosis not present

## 2017-03-01 DIAGNOSIS — K6289 Other specified diseases of anus and rectum: Secondary | ICD-10-CM | POA: Diagnosis not present

## 2017-03-08 DIAGNOSIS — D539 Nutritional anemia, unspecified: Secondary | ICD-10-CM | POA: Diagnosis not present

## 2017-04-04 DIAGNOSIS — Z23 Encounter for immunization: Secondary | ICD-10-CM | POA: Diagnosis not present

## 2017-07-05 DIAGNOSIS — D539 Nutritional anemia, unspecified: Secondary | ICD-10-CM | POA: Diagnosis not present

## 2017-07-05 DIAGNOSIS — F5101 Primary insomnia: Secondary | ICD-10-CM | POA: Diagnosis not present

## 2017-07-05 DIAGNOSIS — I10 Essential (primary) hypertension: Secondary | ICD-10-CM | POA: Diagnosis not present

## 2017-07-05 DIAGNOSIS — E7800 Pure hypercholesterolemia, unspecified: Secondary | ICD-10-CM | POA: Diagnosis not present

## 2017-07-30 DIAGNOSIS — D539 Nutritional anemia, unspecified: Secondary | ICD-10-CM | POA: Diagnosis not present

## 2017-10-11 DIAGNOSIS — Z23 Encounter for immunization: Secondary | ICD-10-CM | POA: Diagnosis not present

## 2017-11-05 DIAGNOSIS — L814 Other melanin hyperpigmentation: Secondary | ICD-10-CM | POA: Diagnosis not present

## 2017-11-05 DIAGNOSIS — D225 Melanocytic nevi of trunk: Secondary | ICD-10-CM | POA: Diagnosis not present

## 2017-11-05 DIAGNOSIS — L821 Other seborrheic keratosis: Secondary | ICD-10-CM | POA: Diagnosis not present

## 2017-11-19 ENCOUNTER — Other Ambulatory Visit: Payer: Self-pay | Admitting: Family Medicine

## 2017-11-19 DIAGNOSIS — E7800 Pure hypercholesterolemia, unspecified: Secondary | ICD-10-CM | POA: Diagnosis not present

## 2017-11-19 DIAGNOSIS — D539 Nutritional anemia, unspecified: Secondary | ICD-10-CM | POA: Diagnosis not present

## 2017-11-19 DIAGNOSIS — Z1231 Encounter for screening mammogram for malignant neoplasm of breast: Secondary | ICD-10-CM

## 2017-11-19 DIAGNOSIS — I10 Essential (primary) hypertension: Secondary | ICD-10-CM | POA: Diagnosis not present

## 2017-11-19 DIAGNOSIS — M797 Fibromyalgia: Secondary | ICD-10-CM | POA: Diagnosis not present

## 2017-11-29 DIAGNOSIS — K449 Diaphragmatic hernia without obstruction or gangrene: Secondary | ICD-10-CM | POA: Diagnosis not present

## 2017-11-29 DIAGNOSIS — K2950 Unspecified chronic gastritis without bleeding: Secondary | ICD-10-CM | POA: Diagnosis not present

## 2017-11-29 DIAGNOSIS — K2900 Acute gastritis without bleeding: Secondary | ICD-10-CM | POA: Diagnosis not present

## 2017-11-29 DIAGNOSIS — D509 Iron deficiency anemia, unspecified: Secondary | ICD-10-CM | POA: Diagnosis not present

## 2017-11-29 DIAGNOSIS — K3189 Other diseases of stomach and duodenum: Secondary | ICD-10-CM | POA: Diagnosis not present

## 2017-12-31 ENCOUNTER — Ambulatory Visit
Admission: RE | Admit: 2017-12-31 | Discharge: 2017-12-31 | Disposition: A | Discharge status: Home / Self Care | Payer: BLUE CROSS/BLUE SHIELD | Source: Ambulatory Visit | Attending: Family Medicine | Admitting: Family Medicine

## 2017-12-31 DIAGNOSIS — Z1231 Encounter for screening mammogram for malignant neoplasm of breast: Secondary | ICD-10-CM

## 2018-07-26 ENCOUNTER — Other Ambulatory Visit: Payer: Self-pay | Admitting: Family Medicine

## 2018-07-26 ENCOUNTER — Other Ambulatory Visit (HOSPITAL_COMMUNITY)
Admission: RE | Admit: 2018-07-26 | Discharge: 2018-07-26 | Disposition: A | Discharge status: Home / Self Care | Payer: BC Managed Care – PPO | Source: Ambulatory Visit | Attending: Dermatology | Admitting: Dermatology

## 2018-07-26 DIAGNOSIS — M797 Fibromyalgia: Secondary | ICD-10-CM | POA: Diagnosis not present

## 2018-07-26 DIAGNOSIS — I10 Essential (primary) hypertension: Secondary | ICD-10-CM | POA: Diagnosis not present

## 2018-07-26 DIAGNOSIS — E7800 Pure hypercholesterolemia, unspecified: Secondary | ICD-10-CM | POA: Diagnosis not present

## 2018-07-26 DIAGNOSIS — Z0000 Encounter for general adult medical examination without abnormal findings: Secondary | ICD-10-CM | POA: Diagnosis not present

## 2018-07-26 DIAGNOSIS — E559 Vitamin D deficiency, unspecified: Secondary | ICD-10-CM | POA: Diagnosis not present

## 2018-07-26 DIAGNOSIS — Z124 Encounter for screening for malignant neoplasm of cervix: Secondary | ICD-10-CM | POA: Diagnosis not present

## 2018-07-26 DIAGNOSIS — Z8249 Family history of ischemic heart disease and other diseases of the circulatory system: Secondary | ICD-10-CM | POA: Diagnosis not present

## 2018-07-27 ENCOUNTER — Other Ambulatory Visit: Payer: Self-pay | Admitting: Family Medicine

## 2018-07-27 DIAGNOSIS — Z1231 Encounter for screening mammogram for malignant neoplasm of breast: Secondary | ICD-10-CM

## 2018-07-27 DIAGNOSIS — M810 Age-related osteoporosis without current pathological fracture: Secondary | ICD-10-CM

## 2018-07-30 LAB — CYTOLOGY - PAP: Diagnosis: NEGATIVE

## 2018-10-04 DIAGNOSIS — Z23 Encounter for immunization: Secondary | ICD-10-CM | POA: Diagnosis not present

## 2018-11-12 DIAGNOSIS — D225 Melanocytic nevi of trunk: Secondary | ICD-10-CM | POA: Diagnosis not present

## 2018-11-12 DIAGNOSIS — L814 Other melanin hyperpigmentation: Secondary | ICD-10-CM | POA: Diagnosis not present

## 2018-11-12 DIAGNOSIS — L821 Other seborrheic keratosis: Secondary | ICD-10-CM | POA: Diagnosis not present

## 2018-11-22 DIAGNOSIS — M25521 Pain in right elbow: Secondary | ICD-10-CM | POA: Diagnosis not present

## 2018-12-13 DIAGNOSIS — M7711 Lateral epicondylitis, right elbow: Secondary | ICD-10-CM | POA: Diagnosis not present

## 2019-01-06 ENCOUNTER — Ambulatory Visit
Admission: RE | Admit: 2019-01-06 | Discharge: 2019-01-06 | Disposition: A | Discharge status: Home / Self Care | Payer: BC Managed Care – PPO | Source: Ambulatory Visit | Attending: Family Medicine | Admitting: Family Medicine

## 2019-01-06 ENCOUNTER — Other Ambulatory Visit: Payer: Self-pay

## 2019-01-06 DIAGNOSIS — M810 Age-related osteoporosis without current pathological fracture: Secondary | ICD-10-CM

## 2019-01-06 DIAGNOSIS — Z1231 Encounter for screening mammogram for malignant neoplasm of breast: Secondary | ICD-10-CM

## 2019-01-06 DIAGNOSIS — Z780 Asymptomatic menopausal state: Secondary | ICD-10-CM | POA: Diagnosis not present

## 2019-01-31 DIAGNOSIS — E7800 Pure hypercholesterolemia, unspecified: Secondary | ICD-10-CM | POA: Diagnosis not present

## 2019-01-31 DIAGNOSIS — K219 Gastro-esophageal reflux disease without esophagitis: Secondary | ICD-10-CM | POA: Diagnosis not present

## 2019-01-31 DIAGNOSIS — M810 Age-related osteoporosis without current pathological fracture: Secondary | ICD-10-CM | POA: Diagnosis not present

## 2019-01-31 DIAGNOSIS — I10 Essential (primary) hypertension: Secondary | ICD-10-CM | POA: Diagnosis not present

## 2019-07-30 DIAGNOSIS — D539 Nutritional anemia, unspecified: Secondary | ICD-10-CM | POA: Diagnosis not present

## 2019-07-30 DIAGNOSIS — M797 Fibromyalgia: Secondary | ICD-10-CM | POA: Diagnosis not present

## 2019-07-30 DIAGNOSIS — E7800 Pure hypercholesterolemia, unspecified: Secondary | ICD-10-CM | POA: Diagnosis not present

## 2019-07-30 DIAGNOSIS — I10 Essential (primary) hypertension: Secondary | ICD-10-CM | POA: Diagnosis not present

## 2019-07-30 DIAGNOSIS — E559 Vitamin D deficiency, unspecified: Secondary | ICD-10-CM | POA: Diagnosis not present

## 2019-07-30 DIAGNOSIS — Z0000 Encounter for general adult medical examination without abnormal findings: Secondary | ICD-10-CM | POA: Diagnosis not present

## 2019-11-19 DIAGNOSIS — L821 Other seborrheic keratosis: Secondary | ICD-10-CM | POA: Diagnosis not present

## 2019-11-19 DIAGNOSIS — L578 Other skin changes due to chronic exposure to nonionizing radiation: Secondary | ICD-10-CM | POA: Diagnosis not present

## 2019-11-19 DIAGNOSIS — L814 Other melanin hyperpigmentation: Secondary | ICD-10-CM | POA: Diagnosis not present

## 2019-11-19 DIAGNOSIS — D225 Melanocytic nevi of trunk: Secondary | ICD-10-CM | POA: Diagnosis not present

## 2019-12-01 ENCOUNTER — Other Ambulatory Visit: Payer: Self-pay | Admitting: Dermatology

## 2019-12-01 ENCOUNTER — Other Ambulatory Visit: Payer: Self-pay | Admitting: Family Medicine

## 2019-12-01 DIAGNOSIS — Z1231 Encounter for screening mammogram for malignant neoplasm of breast: Secondary | ICD-10-CM

## 2020-01-16 ENCOUNTER — Ambulatory Visit: Payer: BC Managed Care – PPO

## 2020-01-30 DIAGNOSIS — E7800 Pure hypercholesterolemia, unspecified: Secondary | ICD-10-CM | POA: Diagnosis not present

## 2020-01-30 DIAGNOSIS — M533 Sacrococcygeal disorders, not elsewhere classified: Secondary | ICD-10-CM | POA: Diagnosis not present

## 2020-01-30 DIAGNOSIS — M797 Fibromyalgia: Secondary | ICD-10-CM | POA: Diagnosis not present

## 2020-01-30 DIAGNOSIS — I10 Essential (primary) hypertension: Secondary | ICD-10-CM | POA: Diagnosis not present

## 2020-02-12 ENCOUNTER — Ambulatory Visit
Admission: RE | Admit: 2020-02-12 | Discharge: 2020-02-12 | Disposition: A | Discharge status: Home / Self Care | Payer: BC Managed Care – PPO | Source: Ambulatory Visit | Attending: Family Medicine | Admitting: Family Medicine

## 2020-02-12 ENCOUNTER — Other Ambulatory Visit: Payer: Self-pay

## 2020-02-12 DIAGNOSIS — Z1231 Encounter for screening mammogram for malignant neoplasm of breast: Secondary | ICD-10-CM

## 2020-03-24 DIAGNOSIS — L820 Inflamed seborrheic keratosis: Secondary | ICD-10-CM | POA: Diagnosis not present

## 2020-03-24 DIAGNOSIS — L309 Dermatitis, unspecified: Secondary | ICD-10-CM | POA: Diagnosis not present

## 2020-04-16 ENCOUNTER — Emergency Department (HOSPITAL_COMMUNITY): Payer: BC Managed Care – PPO

## 2020-04-16 ENCOUNTER — Emergency Department (HOSPITAL_COMMUNITY)
Admission: EM | Admit: 2020-04-16 | Discharge: 2020-04-16 | Disposition: A | Discharge status: Home / Self Care | Payer: BC Managed Care – PPO | Attending: Emergency Medicine | Admitting: Emergency Medicine

## 2020-04-16 ENCOUNTER — Encounter (HOSPITAL_COMMUNITY): Payer: Self-pay

## 2020-04-16 DIAGNOSIS — R404 Transient alteration of awareness: Secondary | ICD-10-CM | POA: Diagnosis not present

## 2020-04-16 DIAGNOSIS — I10 Essential (primary) hypertension: Secondary | ICD-10-CM | POA: Insufficient documentation

## 2020-04-16 DIAGNOSIS — R55 Syncope and collapse: Secondary | ICD-10-CM | POA: Insufficient documentation

## 2020-04-16 DIAGNOSIS — Z79899 Other long term (current) drug therapy: Secondary | ICD-10-CM | POA: Insufficient documentation

## 2020-04-16 DIAGNOSIS — K3189 Other diseases of stomach and duodenum: Secondary | ICD-10-CM | POA: Diagnosis not present

## 2020-04-16 DIAGNOSIS — R0602 Shortness of breath: Secondary | ICD-10-CM | POA: Diagnosis not present

## 2020-04-16 DIAGNOSIS — R42 Dizziness and giddiness: Secondary | ICD-10-CM | POA: Insufficient documentation

## 2020-04-16 DIAGNOSIS — D259 Leiomyoma of uterus, unspecified: Secondary | ICD-10-CM | POA: Diagnosis not present

## 2020-04-16 DIAGNOSIS — R4020 Unspecified coma: Secondary | ICD-10-CM | POA: Diagnosis not present

## 2020-04-16 DIAGNOSIS — K6389 Other specified diseases of intestine: Secondary | ICD-10-CM | POA: Diagnosis not present

## 2020-04-16 LAB — CBC WITH DIFFERENTIAL/PLATELET
Abs Immature Granulocytes: 0.02 10*3/uL (ref 0.00–0.07)
Basophils Absolute: 0.1 10*3/uL (ref 0.0–0.1)
Basophils Relative: 1 %
Eosinophils Absolute: 0.2 10*3/uL (ref 0.0–0.5)
Eosinophils Relative: 3 %
HCT: 35.5 % — ABNORMAL LOW (ref 36.0–46.0)
Hemoglobin: 11.2 g/dL — ABNORMAL LOW (ref 12.0–15.0)
Immature Granulocytes: 0 %
Lymphocytes Relative: 27 %
Lymphs Abs: 2 10*3/uL (ref 0.7–4.0)
MCH: 30.4 pg (ref 26.0–34.0)
MCHC: 31.5 g/dL (ref 30.0–36.0)
MCV: 96.2 fL (ref 80.0–100.0)
Monocytes Absolute: 0.3 10*3/uL (ref 0.1–1.0)
Monocytes Relative: 5 %
Neutro Abs: 4.9 10*3/uL (ref 1.7–7.7)
Neutrophils Relative %: 64 %
Platelets: 264 10*3/uL (ref 150–400)
RBC: 3.69 MIL/uL — ABNORMAL LOW (ref 3.87–5.11)
RDW: 14.2 % (ref 11.5–15.5)
WBC: 7.6 10*3/uL (ref 4.0–10.5)
nRBC: 0 % (ref 0.0–0.2)

## 2020-04-16 LAB — COMPREHENSIVE METABOLIC PANEL
ALT: 53 U/L — ABNORMAL HIGH (ref 0–44)
AST: 69 U/L — ABNORMAL HIGH (ref 15–41)
Albumin: 3.5 g/dL (ref 3.5–5.0)
Alkaline Phosphatase: 96 U/L (ref 38–126)
Anion gap: 8 (ref 5–15)
BUN: 9 mg/dL (ref 8–23)
CO2: 23 mmol/L (ref 22–32)
Calcium: 8.2 mg/dL — ABNORMAL LOW (ref 8.9–10.3)
Chloride: 110 mmol/L (ref 98–111)
Creatinine, Ser: 0.79 mg/dL (ref 0.44–1.00)
GFR, Estimated: >60 mL/min (ref >60)
Glucose, Bld: 126 mg/dL — ABNORMAL HIGH (ref 70–99)
Potassium: 3.5 mmol/L (ref 3.5–5.1)
Sodium: 141 mmol/L (ref 135–145)
Total Bilirubin: 0.3 mg/dL (ref 0.3–1.2)
Total Protein: 6.2 g/dL — ABNORMAL LOW (ref 6.5–8.1)

## 2020-04-16 LAB — LIPASE, BLOOD: Lipase: 41 U/L (ref 11–51)

## 2020-04-16 LAB — TROPONIN I (HIGH SENSITIVITY)
Troponin I (High Sensitivity): 3 ng/L (ref <18)
Troponin I (High Sensitivity): 3 ng/L (ref <18)

## 2020-04-16 LAB — BRAIN NATRIURETIC PEPTIDE: B Natriuretic Peptide: 8.2 pg/mL (ref 0.0–100.0)

## 2020-04-16 MED ORDER — IOHEXOL 300 MG/ML  SOLN
100.0000 mL | Freq: Once | INTRAMUSCULAR | Status: AC | PRN
  Administered 2020-04-16: 100 mL via INTRAVENOUS

## 2020-04-16 MED ORDER — MORPHINE SULFATE (PF) 4 MG/ML IV SOLN
4.0000 mg | Freq: Once | INTRAVENOUS | Status: AC
  Administered 2020-04-16: 4 mg via INTRAVENOUS
  Filled 2020-04-16: qty 1

## 2020-04-16 MED ORDER — ONDANSETRON HCL 4 MG/2ML IJ SOLN
4.0000 mg | Freq: Once | INTRAMUSCULAR | Status: AC
  Administered 2020-04-16: 4 mg via INTRAVENOUS
  Filled 2020-04-16: qty 2

## 2020-04-16 NOTE — ED Triage Notes (Signed)
Pt was having dinner at a restaurant where pt went to the bathroom. Found unresponsive on the bathroom floor. EMS gave 1L bolus and bp was 90/40. BS 137. Hx of HTN and fibromyalgia. Alert and oriented x 4.

## 2020-04-16 NOTE — ED Notes (Signed)
Pt was eating at [facility] for dinner. Pt went to the bathroom after eating. Found unresponsive on the bathroom floor. EMS reports unable to get BP initially. 1L NS bolus given and pt had an initial bp of 90/40. Pt was initially confused and was complaining of nausea. IVP Zofran 4mg  given by EMS. Pt is now alert and oriented x 4. Reports dizziness and abdominal cramping that started after eating dinner. Negative orthostatic VS. Pt appears pale and uncomfortable. Continuous cardiac monitoring initiated.

## 2020-04-16 NOTE — ED Notes (Signed)
Assuming care. Pt sitting on bedside commode. Assisted to bed. bed @ this time w/ NAD noted. VSS. Ordered pain medication given. Aware awaiting to go to CT. Cardiac monitoring in place w/ bp cycling and spo2 being monnitored. Updated on plan of care. Deny needs or concerns @ this time. Bed low and locked.

## 2020-04-16 NOTE — ED Notes (Signed)
Medications follow up appts reviewed w/ pt.  Denies questions or concerns @ this time. Pt evaluated for syncopal episode. Has not has any since arrival . Education given preventing dehydration.  Education on s/s of worsening and when to return.  Left w/ even and steady gait. NAD noted. Accompanied by husband, PIV removed and VSS.

## 2020-04-16 NOTE — ED Provider Notes (Signed)
[facility] EMERGENCY DEPARTMENT Provider Note   CSN: [id number] Arrival date & time: 04/16/20  1651     History Chief Complaint  Patient presents with  . Loss of Consciousness    Patricia Walls is a 65 y.o. female.  HPI Patient is a 65 year old female with a history of depression and esophageal dysmotility as well as hypertension and fibromyalgia following in the outpatient setting with her primary care provider presenting with a chief complaint of syncope at a restaurant.  Patient was doing well throughout the day today but had a difficult bowel movement in the restroom.  When she got up to walk to the bathroom she began to feel lightheaded and then syncopized out from.  Patient has no history of heart disease or heart failure.  Patient is otherwise healthy and up-to-date on occasions.  No recent changes. Patient has been ambulatory tolerating p.o. intake without any difficulty lately.  Patient did endorse hitting her head after she had fallen onto her bottom.  History of similar a few years ago that was idiopathic.   Past Medical History:  Diagnosis Date  . Depression 08/10/2015  . Depression   . Esophageal dysmotility 08/10/2015   Followed and in a study at [facility] - Dr. Adriana Simas  . Essential hypertension 08/10/2015  . Fibromyalgia   . Fibromyalgia   . Lyme disease     Patient Active Problem List   Diagnosis Date Noted  . Acute appendicitis 08/10/2015  . Esophageal dysmotility 08/10/2015  . Depression 08/10/2015  . Essential hypertension 08/10/2015    Past Surgical History:  Procedure Laterality Date  . APPENDECTOMY    . BREAST BIOPSY Bilateral 2009   benign  . DIAGNOSTIC LAPAROSCOPY N/A    Fertility evaluation  . extraction of wisdom teeth    . LAPAROSCOPIC APPENDECTOMY N/A 08/10/2015   Procedure: APPENDECTOMY LAPAROSCOPIC;  Surgeon: Avel Peace, MD;  Location: [facility] ORS;  Service: General;  Laterality: N/A;     OB History   No obstetric  history on file.     Family History  Problem Relation Age of Onset  . Hypertension Other   . Hyperlipidemia Other     Social History   Tobacco Use  . Smoking status: Never Smoker  . Smokeless tobacco: Never Used  Vaping Use  . Vaping Use: Never used  Substance Use Topics  . Alcohol use: No  . Drug use: No    Home Medications Prior to Admission medications   Medication Sig Start Date End Date Taking? Authorizing Provider  amLODipine (NORVASC) 10 MG tablet Take 10 mg by mouth daily with breakfast.    [provider]  calcium-vitamin D (OSCAL WITH D) 500-200 MG-UNIT tablet Take 1 tablet by mouth daily.    [provider]  cloNIDine (CATAPRES) 0.2 MG tablet Take 0.2 mg by mouth every evening.    [provider]  gabapentin (NEURONTIN) 300 MG capsule Take 300 mg by mouth 3 (three) times daily.    [provider]  hydrochlorothiazide (MICROZIDE) 12.5 MG capsule Take 12.5 mg by mouth daily with breakfast.    [provider]  ibuprofen (MOTRIN IB) 200 MG tablet You can take 2-3 tablets every 6 hours as needed for pain.  You can buy this over the counter at any drug store. 08/11/15   Sherrie George, PA-C  lisinopril (PRINIVIL,ZESTRIL) 40 MG tablet Take 40 mg by mouth every evening.    [provider]  Multiple Vitamin (MULTIVITAMIN WITH MINERALS) TABS  tablet Take 1 tablet by mouth daily with breakfast.    [provider]  nortriptyline (PAMELOR) 50 MG capsule Take 100 mg by mouth every morning.    [provider]  ondansetron (ZOFRAN-ODT) 4 MG disintegrating tablet Take 4 mg by mouth once.    [provider]  oxyCODONE (OXY IR/ROXICODONE) 5 MG immediate release tablet Take 1-2 tablets (5-10 mg total) by mouth every 4 (four) hours as needed for moderate pain. 08/11/15   Sherrie George, PA-C  pantoprazole (PROTONIX) 40 MG tablet Take 40 mg by mouth 2 (two) times daily.    [provider]  Probiotic  Product (DIGESTIVE ADVANTAGE PO) Take 2 tablets by mouth daily.    [provider]  pyridostigmine (MESTINON) 60 MG tablet Take 30 mg by mouth 3 (three) times daily.    [provider]  ranitidine (ZANTAC) 150 MG tablet Take 150 mg by mouth 2 (two) times daily.    [provider]  simvastatin (ZOCOR) 20 MG tablet Take 20 mg by mouth every evening.    [provider]  tiZANidine (ZANAFLEX) 4 MG tablet Take 4 mg by mouth at bedtime.    [provider]  zolpidem (AMBIEN) 10 MG tablet Take 5-10 mg by mouth at bedtime as needed for sleep.    [provider]    Allergies    Patient has no known allergies.  Review of Systems   Review of Systems  Constitutional: Negative for chills and fever.  HENT: Negative for ear pain and sore throat.   Eyes: Negative for pain and visual disturbance.  Respiratory: Negative for cough and shortness of breath.   Cardiovascular: Negative for chest pain and palpitations.  Gastrointestinal: Negative for abdominal pain and vomiting.  Genitourinary: Negative for dysuria and hematuria.  Musculoskeletal: Negative for arthralgias and back pain.  Skin: Negative for color change and rash.  Neurological: Negative for seizures and syncope.  All other systems reviewed and are negative.   Physical Exam Updated Vital Signs BP 122/68   Pulse 72   Temp (!) 97.4 F (36.3 C) (Oral)   Resp 15   Ht 5\' 6"  (1.676 m)   Wt 65.8 kg   SpO2 95%   BMI 23.40 kg/m   Physical Exam Vitals and nursing note reviewed.  Constitutional:      General: She is not in acute distress.    Appearance: She is well-developed.  HENT:     Head: Normocephalic and atraumatic.  Eyes:     Conjunctiva/sclera: Conjunctivae normal.  Cardiovascular:     Rate and Rhythm: Normal rate and regular rhythm.     Heart sounds: No murmur heard.   Pulmonary:     Effort: Pulmonary effort is normal. No respiratory distress.     Breath sounds: Normal  breath sounds.  Abdominal:     General: There is no distension.     Palpations: Abdomen is soft.     Tenderness: There is abdominal tenderness. There is no right CVA tenderness or left CVA tenderness.  Musculoskeletal:        General: No swelling or tenderness. Normal range of motion.     Cervical back: Neck supple.  Skin:    General: Skin is warm and dry.  Neurological:     General: No focal deficit present.     Mental Status: She is alert and oriented to person, place, and time. Mental status is at baseline.     Cranial Nerves: No cranial nerve deficit.  ED Results / Procedures / Treatments   Labs (all labs ordered are listed, but only abnormal results are displayed) Labs Reviewed  CBC WITH DIFFERENTIAL/PLATELET - Abnormal; Notable for the following components:      Result Value   RBC 3.69 (*)    Hemoglobin 11.2 (*)    HCT 35.5 (*)    All other components within normal limits  COMPREHENSIVE METABOLIC PANEL - Abnormal; Notable for the following components:   Glucose, Bld 126 (*)    Calcium 8.2 (*)    Total Protein 6.2 (*)    AST 69 (*)    ALT 53 (*)    All other components within normal limits  LIPASE, BLOOD  BRAIN NATRIURETIC PEPTIDE  TROPONIN I (HIGH SENSITIVITY)  TROPONIN I (HIGH SENSITIVITY)    EKG EKG Interpretation  Date/Time:  Friday April 16 2020 17:06:52 EDT Ventricular Rate:  59 PR Interval:  180 QRS Duration: 107 QT Interval:  480 QTC Calculation: 476 R Axis:   33 Text Interpretation: Sinus rhythm Low voltage, precordial leads No previous ECGs available Confirmed by Frederick Peers [phone]) on 04/16/2020 5:48:06 PM   Radiology CT Head Wo Contrast  Result Date: 04/16/2020 CLINICAL DATA:  Dizziness. EXAM: CT HEAD WITHOUT CONTRAST TECHNIQUE: Contiguous axial images were obtained from the base of the skull through the vertex without intravenous contrast. COMPARISON:  None. FINDINGS: Brain: The ventricles are normal in size and configuration. No  extra-axial fluid collections are identified. The gray-white differentiation is maintained. No CT findings for acute hemispheric infarction or intracranial hemorrhage. No mass lesions. The brainstem and cerebellum are normal. Vascular: No hyperdense vessels or obvious aneurysm. Skull: No acute skull fracture.  No bone lesion. Sinuses/Orbits: The paranasal sinuses and mastoid air cells are clear. The globes are intact. Other: No scalp lesions, laceration or hematoma. IMPRESSION: Normal head CT. Electronically Signed   By: Rudie Meyer M.D.   On: 04/16/2020 18:50   DG Chest Portable 1 View  Result Date: 04/16/2020 CLINICAL DATA:  Syncope EXAM: PORTABLE CHEST 1 VIEW COMPARISON:  June 16, 2010 FINDINGS: The lungs are clear. Heart is upper normal in size with pulmonary vascularity normal. No adenopathy. No pneumothorax. No bone lesions. IMPRESSION: Lungs clear.  Heart upper normal in size. Electronically Signed   By: Bretta Bang III M.D.   On: 04/16/2020 17:59     Medications Ordered in ED Medications  ondansetron (ZOFRAN) injection 4 mg (4 mg Intravenous Given 04/16/20 1821)    ED Course  I have reviewed the triage vital signs and the nursing notes.  Pertinent labs & imaging results that were available during my care of the patient were reviewed by me and considered in my medical decision making (see chart for details).    MDM Rules/Calculators/A&P                          Patient is a 65 year old female with a chief complaint of syncope.  On exam, patient has no acute abnormalities.  Neurologic exam is intact and without focal deficits.  Orthostatic blood pressures were completed within normal limits after fluid rehydration by EMS.  Blood pressure now within normal limits. Patient's history of present illness and physical exam findings are most consistent with vasovagal syncope given her straining on the toilet leading to the event.   Per [place] syncope rules, patient falls in the low risk  group INPUTS: Predisposition to vasovagal symptoms --> 0 = No Heart disease history -->  0 = No sBP 180 mmHg --> 0 = No Elevated troponin --> 0 = No Abnormal QRS axis --> 0 = No QRS duration &gt;130 ms --> 0 = No Corrected QT interval >480 ms --> 0 = No ED diagnosis --> -2 = Vasovagal syncope  Additionally per [facility] rules: Patient IS in the low-risk group for serious outcome. INPUTS: Congestive heart failure history --> 0 = No Hematocrit<30%  --> 0 = No EKG abnormal (EKG changed, or any non-sinus rhythm on EKG or monitoring) --> 0 = No Shortness of breath symptoms --> 0 = No Systolic BP  --> 0 = No  Fever patient's syncopal episode is most likely vasovagal.  Additionally, patient's abdominal pain seemed to develop during her evaluation in the emergency department.  We will proceed with CT abdomen pelvis and appropriate therapies to control her pain at this time.  Labs without acute abnormality of LFT or otherwise at this time.  CT resulted with no acute abnormalities at this time.  On reassessment after medication administration, patient now in no acute distress.  Discussed patient's case with her and she requested discharge for continued outpatient valuation and management.  No acute etiology of patient symptoms discovered today but multiple emergent etiologies ruled out.  Patient stable for continued outpatient evaluation management with her primary care provider and possible referral to cardiology per their discretion.  Final Clinical Impression(s) / ED Diagnoses Final diagnoses:  None    Rx / DC Orders ED Discharge Orders    None       Glyn Adeountryman, Neely Cecena, MD 04/16/20 2307    Clarene DukeLittle, Ambrose Finlandachel Morgan, MD 04/19/20 [phone]

## 2020-04-16 NOTE — ED Notes (Signed)
Pt had 3 diarrhea episodes since arrival in ED. Will notify MD.

## 2020-04-16 NOTE — ED Notes (Signed)
Provider bedside for update

## 2020-04-16 NOTE — Discharge Instructions (Signed)
You were seen today for syncope.  We have attached information on what passing out may look like.  Did not have an obvious reason for what happened today.  Laboratory evaluation and CT scan without acute abnormality.  It is important for you to follow-up with your primary care provider and discuss further evaluation and management outpatient. If you can have any further chest pain, nausea or vomiting, syncope or change in your symptoms, please return to the emergency department for further evaluation and management.

## 2020-04-16 NOTE — ED Notes (Signed)
Pt resting in bed @ this time w/ NAD noted. VSS. Cardiac monitoring in place w/ bp cycling and spo2 being monnitored. Updated on plan of care. Deny needs or concerns @ this time. Bed low and locked. 

## 2020-04-28 DIAGNOSIS — K602 Anal fissure, unspecified: Secondary | ICD-10-CM | POA: Diagnosis not present

## 2020-04-28 DIAGNOSIS — R252 Cramp and spasm: Secondary | ICD-10-CM | POA: Diagnosis not present

## 2020-04-28 DIAGNOSIS — R55 Syncope and collapse: Secondary | ICD-10-CM | POA: Diagnosis not present

## 2020-08-06 DIAGNOSIS — Z23 Encounter for immunization: Secondary | ICD-10-CM | POA: Diagnosis not present

## 2020-08-06 DIAGNOSIS — E7800 Pure hypercholesterolemia, unspecified: Secondary | ICD-10-CM | POA: Diagnosis not present

## 2020-08-06 DIAGNOSIS — Z0000 Encounter for general adult medical examination without abnormal findings: Secondary | ICD-10-CM | POA: Diagnosis not present

## 2020-08-06 DIAGNOSIS — I10 Essential (primary) hypertension: Secondary | ICD-10-CM | POA: Diagnosis not present

## 2020-08-06 DIAGNOSIS — M797 Fibromyalgia: Secondary | ICD-10-CM | POA: Diagnosis not present

## 2020-08-11 ENCOUNTER — Other Ambulatory Visit: Payer: Self-pay | Admitting: Family Medicine

## 2020-08-11 DIAGNOSIS — M810 Age-related osteoporosis without current pathological fracture: Secondary | ICD-10-CM

## 2020-10-05 ENCOUNTER — Other Ambulatory Visit: Payer: Self-pay | Admitting: Family Medicine

## 2020-10-05 DIAGNOSIS — Z1231 Encounter for screening mammogram for malignant neoplasm of breast: Secondary | ICD-10-CM

## 2020-10-07 DIAGNOSIS — R12 Heartburn: Secondary | ICD-10-CM | POA: Diagnosis not present

## 2020-10-07 DIAGNOSIS — R1319 Other dysphagia: Secondary | ICD-10-CM | POA: Diagnosis not present

## 2020-10-14 DIAGNOSIS — M545 Low back pain, unspecified: Secondary | ICD-10-CM | POA: Diagnosis not present

## 2020-10-29 DIAGNOSIS — R102 Pelvic and perineal pain: Secondary | ICD-10-CM | POA: Diagnosis not present

## 2020-11-04 DIAGNOSIS — M545 Low back pain, unspecified: Secondary | ICD-10-CM | POA: Diagnosis not present

## 2020-11-23 DIAGNOSIS — L814 Other melanin hyperpigmentation: Secondary | ICD-10-CM | POA: Diagnosis not present

## 2020-11-23 DIAGNOSIS — D225 Melanocytic nevi of trunk: Secondary | ICD-10-CM | POA: Diagnosis not present

## 2020-11-23 DIAGNOSIS — L578 Other skin changes due to chronic exposure to nonionizing radiation: Secondary | ICD-10-CM | POA: Diagnosis not present

## 2020-11-23 DIAGNOSIS — D485 Neoplasm of uncertain behavior of skin: Secondary | ICD-10-CM | POA: Diagnosis not present

## 2020-11-23 DIAGNOSIS — L821 Other seborrheic keratosis: Secondary | ICD-10-CM | POA: Diagnosis not present

## 2020-11-26 DIAGNOSIS — M7072 Other bursitis of hip, left hip: Secondary | ICD-10-CM | POA: Diagnosis not present

## 2021-02-07 DIAGNOSIS — I10 Essential (primary) hypertension: Secondary | ICD-10-CM | POA: Diagnosis not present

## 2021-02-07 DIAGNOSIS — M797 Fibromyalgia: Secondary | ICD-10-CM | POA: Diagnosis not present

## 2021-02-07 DIAGNOSIS — K3184 Gastroparesis: Secondary | ICD-10-CM | POA: Diagnosis not present

## 2021-02-07 DIAGNOSIS — E7800 Pure hypercholesterolemia, unspecified: Secondary | ICD-10-CM | POA: Diagnosis not present

## 2021-03-17 ENCOUNTER — Other Ambulatory Visit: Payer: BC Managed Care – PPO

## 2021-03-17 ENCOUNTER — Ambulatory Visit
Admission: RE | Admit: 2021-03-17 | Discharge: 2021-03-17 | Disposition: A | Discharge status: Home / Self Care | Payer: BC Managed Care – PPO | Source: Ambulatory Visit | Attending: Family Medicine | Admitting: Family Medicine

## 2021-03-17 DIAGNOSIS — Z1231 Encounter for screening mammogram for malignant neoplasm of breast: Secondary | ICD-10-CM

## 2021-03-18 ENCOUNTER — Other Ambulatory Visit: Payer: Self-pay | Admitting: Family Medicine

## 2021-03-18 DIAGNOSIS — N6320 Unspecified lump in the left breast, unspecified quadrant: Secondary | ICD-10-CM

## 2021-04-18 ENCOUNTER — Ambulatory Visit
Admission: RE | Admit: 2021-04-18 | Discharge: 2021-04-18 | Disposition: A | Discharge status: Home / Self Care | Payer: BC Managed Care – PPO | Source: Ambulatory Visit | Attending: Family Medicine | Admitting: Family Medicine

## 2021-04-18 DIAGNOSIS — R922 Inconclusive mammogram: Secondary | ICD-10-CM | POA: Diagnosis not present

## 2021-04-18 DIAGNOSIS — N6320 Unspecified lump in the left breast, unspecified quadrant: Secondary | ICD-10-CM

## 2021-08-15 DIAGNOSIS — I10 Essential (primary) hypertension: Secondary | ICD-10-CM | POA: Diagnosis not present

## 2021-08-15 DIAGNOSIS — E559 Vitamin D deficiency, unspecified: Secondary | ICD-10-CM | POA: Diagnosis not present

## 2021-08-15 DIAGNOSIS — E7800 Pure hypercholesterolemia, unspecified: Secondary | ICD-10-CM | POA: Diagnosis not present

## 2021-08-15 DIAGNOSIS — K3184 Gastroparesis: Secondary | ICD-10-CM | POA: Diagnosis not present

## 2021-08-15 DIAGNOSIS — Z0000 Encounter for general adult medical examination without abnormal findings: Secondary | ICD-10-CM | POA: Diagnosis not present

## 2021-08-16 ENCOUNTER — Other Ambulatory Visit: Payer: Self-pay | Admitting: Family Medicine

## 2021-08-16 DIAGNOSIS — M810 Age-related osteoporosis without current pathological fracture: Secondary | ICD-10-CM

## 2021-10-23 IMAGING — MG MM DIGITAL SCREENING BILAT W/ TOMO AND CAD
6 of 10 series · 6 of 30 positions shown · non-contrast
Comparison: Previous exam(s).

CLINICAL DATA: Screening.

EXAM:
DIGITAL SCREENING BILATERAL MAMMOGRAM WITH TOMOSYNTHESIS AND CAD

[R MLO synth-2D]
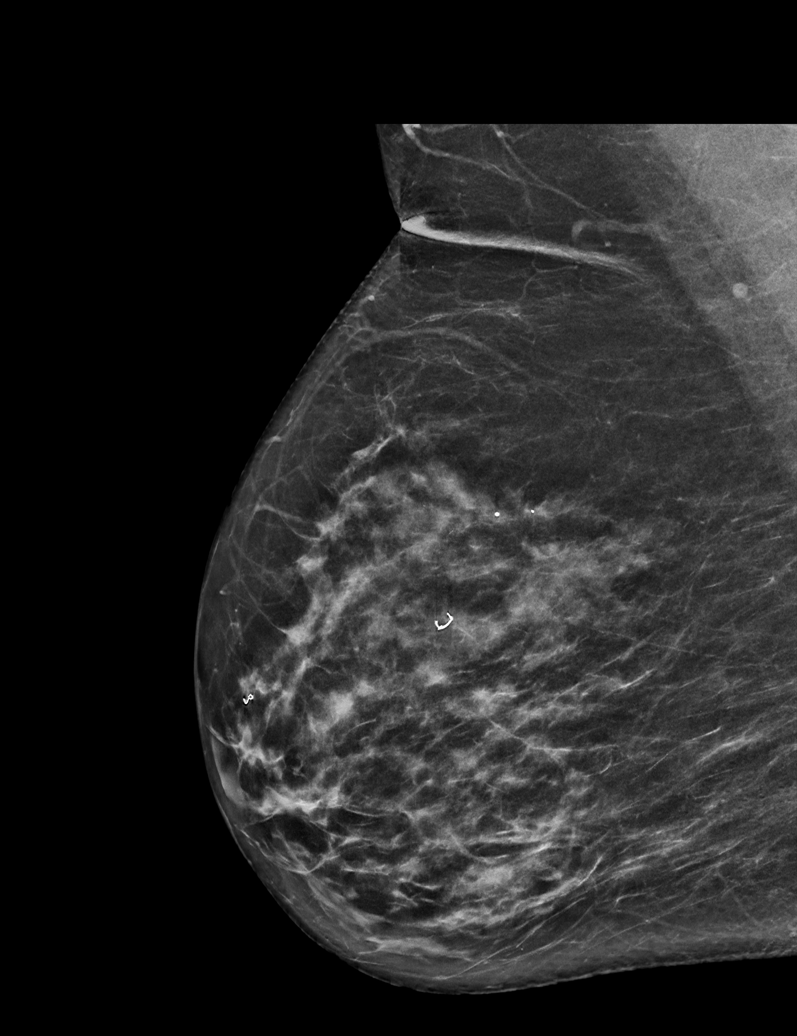

[L MLO synth-2D (1 of 2)]
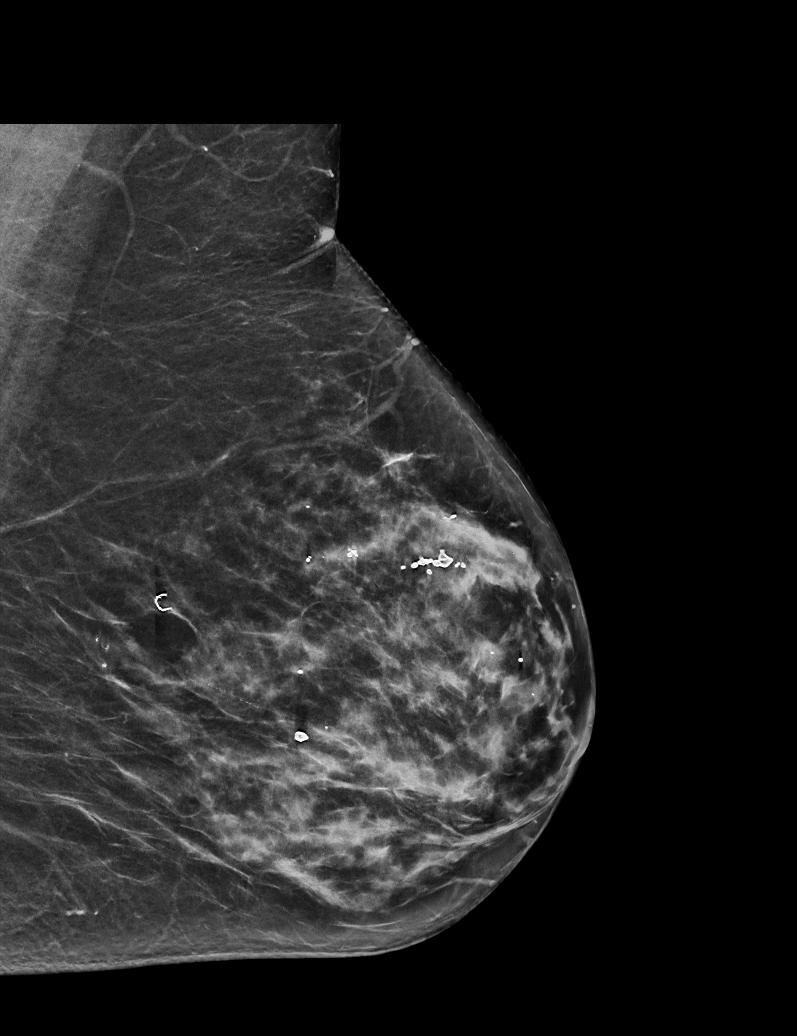

[L CC synth-2D]
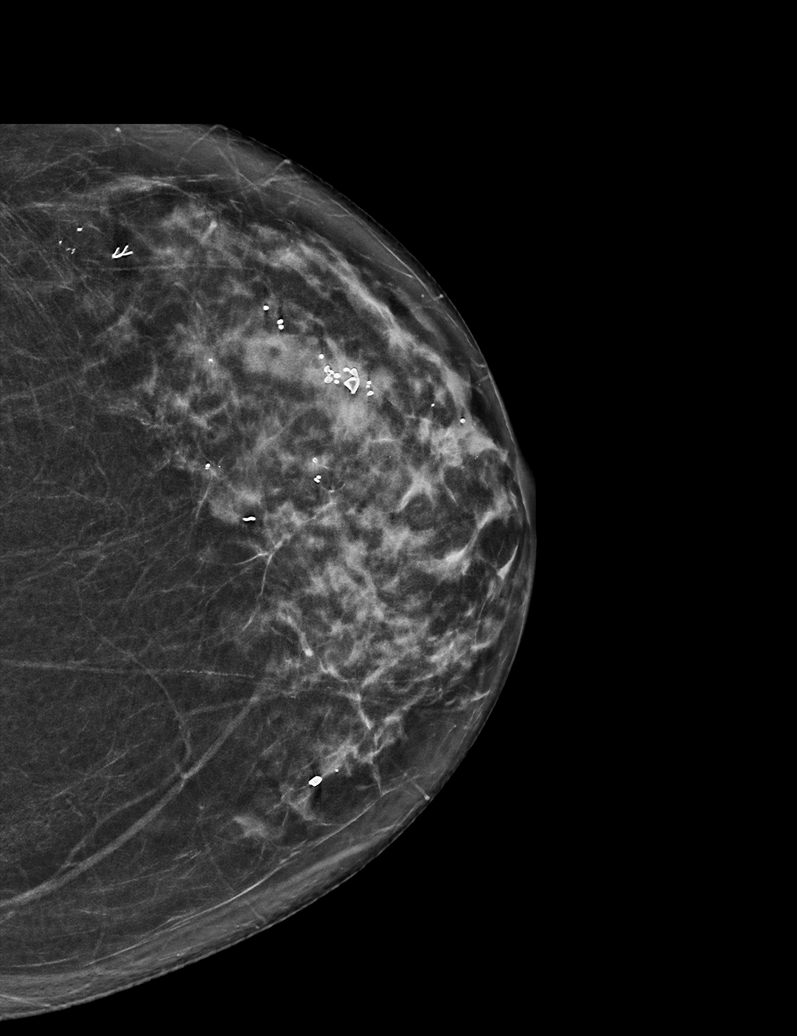

[R CC synth-2D]
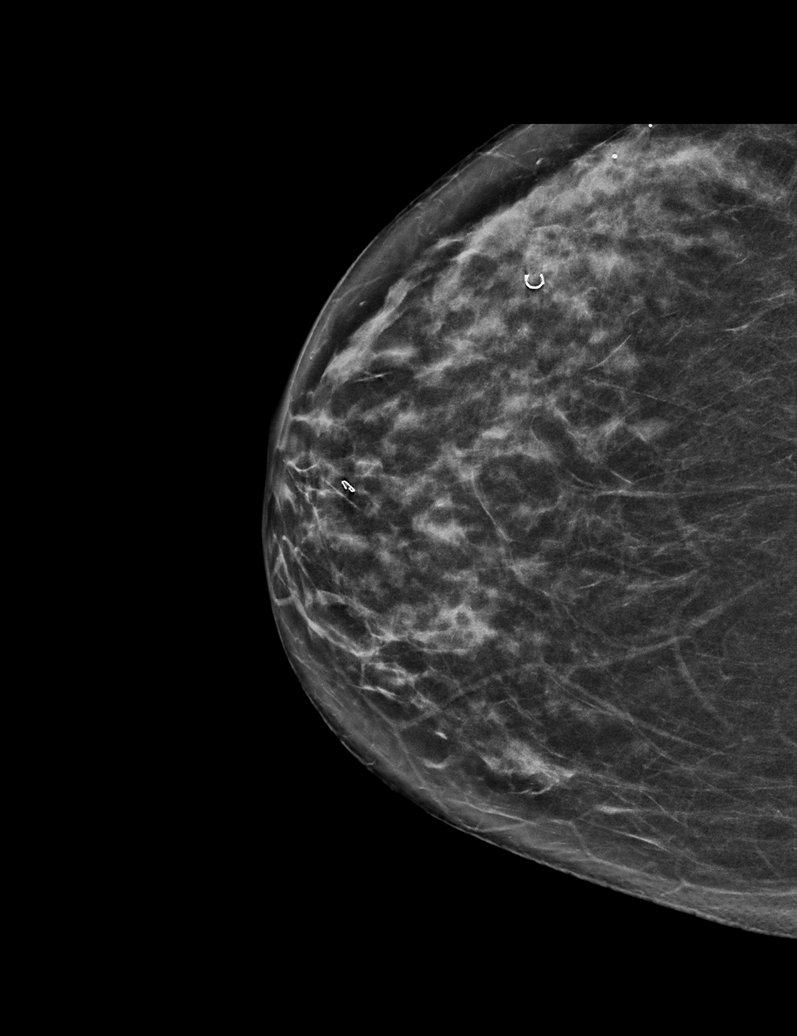

[L MLO synth-2D (2 of 2)]
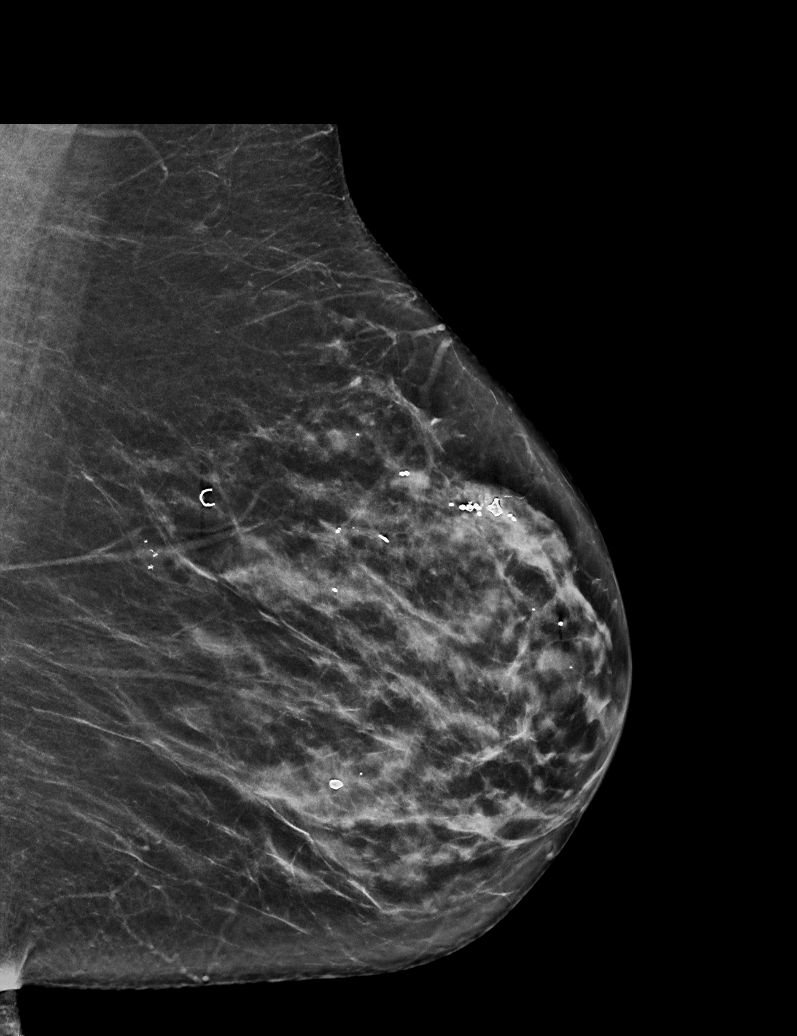

[L MLO tomo · tomo slice 33/65.0]
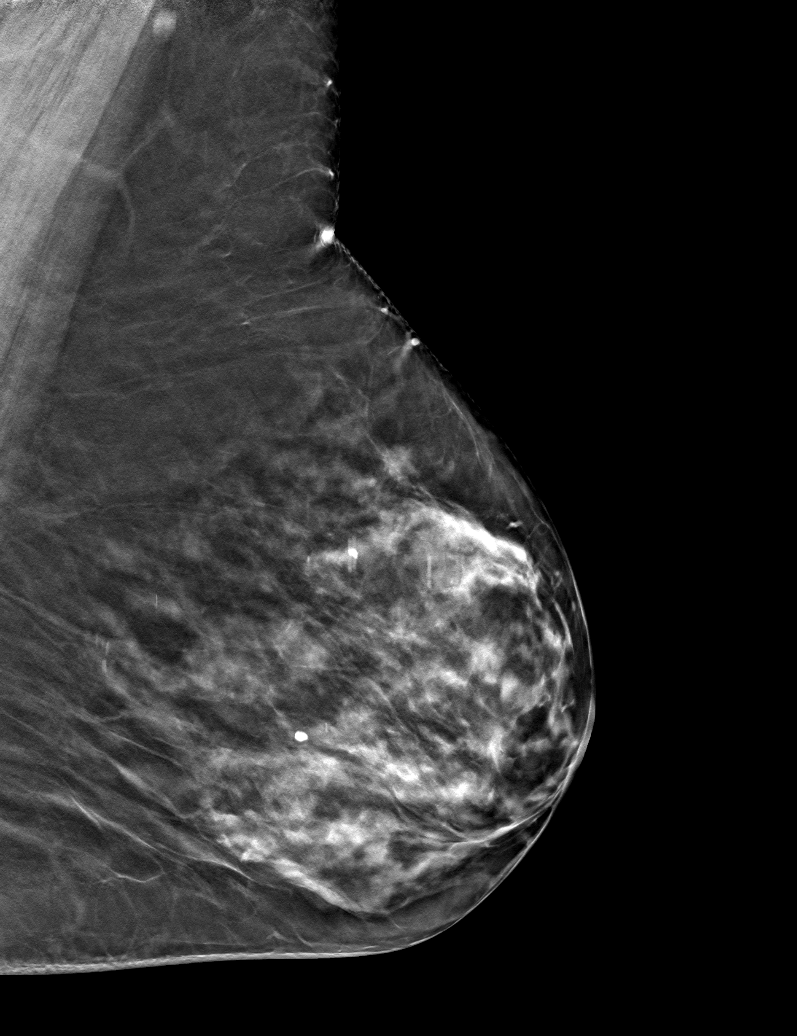

[6 of 30 positions shown; findings below may reference images not displayed]

ACR Breast Density Category c: The breast tissue is heterogeneously
dense, which may obscure small masses.
FINDINGS: There are no findings suspicious for malignancy. The images were
evaluated with computer-aided detection.
IMPRESSION: No mammographic evidence of malignancy. A result letter of this
screening mammogram will be mailed directly to the patient.

RECOMMENDATION:
Screening mammogram in one year. (Code:[id number]-[id number])

BI-RADS CATEGORY  1: Negative.

## 2021-12-12 DIAGNOSIS — L814 Other melanin hyperpigmentation: Secondary | ICD-10-CM | POA: Diagnosis not present

## 2021-12-12 DIAGNOSIS — L578 Other skin changes due to chronic exposure to nonionizing radiation: Secondary | ICD-10-CM | POA: Diagnosis not present

## 2021-12-12 DIAGNOSIS — L821 Other seborrheic keratosis: Secondary | ICD-10-CM | POA: Diagnosis not present

## 2021-12-12 DIAGNOSIS — D225 Melanocytic nevi of trunk: Secondary | ICD-10-CM | POA: Diagnosis not present

## 2022-02-09 ENCOUNTER — Other Ambulatory Visit: Payer: BC Managed Care – PPO

## 2022-02-21 DIAGNOSIS — I10 Essential (primary) hypertension: Secondary | ICD-10-CM | POA: Diagnosis not present

## 2022-02-21 DIAGNOSIS — E559 Vitamin D deficiency, unspecified: Secondary | ICD-10-CM | POA: Diagnosis not present

## 2022-02-21 DIAGNOSIS — E7800 Pure hypercholesterolemia, unspecified: Secondary | ICD-10-CM | POA: Diagnosis not present

## 2022-02-21 DIAGNOSIS — K3184 Gastroparesis: Secondary | ICD-10-CM | POA: Diagnosis not present

## 2022-04-28 ENCOUNTER — Other Ambulatory Visit: Payer: Self-pay | Admitting: Family Medicine

## 2022-04-28 DIAGNOSIS — Z1231 Encounter for screening mammogram for malignant neoplasm of breast: Secondary | ICD-10-CM

## 2022-05-08 ENCOUNTER — Ambulatory Visit
Admission: RE | Admit: 2022-05-08 | Discharge: 2022-05-08 | Disposition: A | Discharge status: Home / Self Care | Payer: Medicare Other | Source: Ambulatory Visit | Attending: Family Medicine | Admitting: Family Medicine

## 2022-05-08 DIAGNOSIS — Z1231 Encounter for screening mammogram for malignant neoplasm of breast: Secondary | ICD-10-CM

## 2022-05-10 ENCOUNTER — Other Ambulatory Visit: Payer: Self-pay | Admitting: Family Medicine

## 2022-05-10 DIAGNOSIS — R928 Other abnormal and inconclusive findings on diagnostic imaging of breast: Secondary | ICD-10-CM

## 2022-05-25 ENCOUNTER — Ambulatory Visit
Admission: RE | Admit: 2022-05-25 | Discharge: 2022-05-25 | Disposition: A | Discharge status: Home / Self Care | Payer: Medicare Other | Source: Ambulatory Visit | Attending: Family Medicine | Admitting: Family Medicine

## 2022-05-25 ENCOUNTER — Ambulatory Visit: Payer: Medicare Other

## 2022-05-25 DIAGNOSIS — R928 Other abnormal and inconclusive findings on diagnostic imaging of breast: Secondary | ICD-10-CM

## 2022-08-23 ENCOUNTER — Other Ambulatory Visit: Payer: Self-pay | Admitting: Family Medicine

## 2022-08-23 DIAGNOSIS — M810 Age-related osteoporosis without current pathological fracture: Secondary | ICD-10-CM

## 2023-02-26 ENCOUNTER — Ambulatory Visit
Admission: RE | Admit: 2023-02-26 | Discharge: 2023-02-26 | Disposition: A | Discharge status: Home / Self Care | Payer: Medicare Other | Source: Ambulatory Visit | Attending: Family Medicine | Admitting: Family Medicine

## 2023-02-26 DIAGNOSIS — M810 Age-related osteoporosis without current pathological fracture: Secondary | ICD-10-CM

## 2023-08-30 ENCOUNTER — Other Ambulatory Visit (HOSPITAL_BASED_OUTPATIENT_CLINIC_OR_DEPARTMENT_OTHER): Payer: Self-pay | Admitting: Family Medicine

## 2023-08-30 DIAGNOSIS — Z136 Encounter for screening for cardiovascular disorders: Secondary | ICD-10-CM

## 2023-09-24 ENCOUNTER — Ambulatory Visit (HOSPITAL_BASED_OUTPATIENT_CLINIC_OR_DEPARTMENT_OTHER)
Admission: RE | Admit: 2023-09-24 | Discharge: 2023-09-24 | Disposition: A | Discharge status: Home / Self Care | Payer: Self-pay | Source: Ambulatory Visit | Attending: Family Medicine | Admitting: Family Medicine

## 2023-09-24 DIAGNOSIS — Z136 Encounter for screening for cardiovascular disorders: Secondary | ICD-10-CM

## 2023-10-11 ENCOUNTER — Other Ambulatory Visit: Payer: Self-pay | Admitting: Family Medicine

## 2023-10-11 DIAGNOSIS — Z1231 Encounter for screening mammogram for malignant neoplasm of breast: Secondary | ICD-10-CM

## 2023-11-06 ENCOUNTER — Ambulatory Visit
Admission: RE | Admit: 2023-11-06 | Discharge: 2023-11-06 | Disposition: A | Discharge status: Home / Self Care | Source: Ambulatory Visit | Attending: Family Medicine | Admitting: Family Medicine

## 2023-11-06 DIAGNOSIS — Z1231 Encounter for screening mammogram for malignant neoplasm of breast: Secondary | ICD-10-CM
